# Patient Record
Sex: Female | Born: 1985
Health system: Southern US, Community
[De-identification: ages and names within clinical notes are randomized; demographics above are authoritative.]

## PROBLEM LIST (undated history)

## (undated) HISTORY — PX: RENAL BIOPSY: SHX156

---

## 2003-06-27 ENCOUNTER — Emergency Department (HOSPITAL_COMMUNITY): Admission: EM | Admit: 2003-06-27 | Discharge: 2003-06-27 | Payer: Self-pay | Admitting: Emergency Medicine

## 2012-04-28 ENCOUNTER — Encounter (HOSPITAL_COMMUNITY): Payer: Self-pay | Admitting: *Deleted

## 2012-04-28 ENCOUNTER — Emergency Department (HOSPITAL_COMMUNITY)
Admission: EM | Admit: 2012-04-28 | Discharge: 2012-04-29 | Disposition: A | Discharge status: Home / Self Care | Payer: Self-pay | Attending: Emergency Medicine | Admitting: Emergency Medicine

## 2012-04-28 DIAGNOSIS — K0889 Other specified disorders of teeth and supporting structures: Secondary | ICD-10-CM

## 2012-04-28 DIAGNOSIS — K029 Dental caries, unspecified: Secondary | ICD-10-CM | POA: Insufficient documentation

## 2012-04-28 DIAGNOSIS — F17200 Nicotine dependence, unspecified, uncomplicated: Secondary | ICD-10-CM | POA: Insufficient documentation

## 2012-04-28 NOTE — ED Notes (Signed)
Pt has had tooth pain x 3 weeks.  Tonight the pain became unbearable.  PT appears in great pain.

## 2012-04-29 MED ORDER — OXYCODONE-ACETAMINOPHEN 5-325 MG PO TABS
2.0000 | ORAL_TABLET | Freq: Once | ORAL | Status: AC
  Administered 2012-04-29: 2 via ORAL
  Filled 2012-04-29: qty 2

## 2012-04-29 MED ORDER — IBUPROFEN 800 MG PO TABS: 800.0000 mg | ORAL_TABLET | Freq: Three times a day (TID) | ORAL | Status: AC

## 2012-04-29 MED ORDER — HYDROCODONE-ACETAMINOPHEN 7.5-500 MG/15ML PO SOLN: 15.0000 mL | Freq: Four times a day (QID) | ORAL | Status: AC | PRN

## 2012-04-29 MED ORDER — PENICILLIN V POTASSIUM 500 MG PO TABS: 500.0000 mg | ORAL_TABLET | Freq: Four times a day (QID) | ORAL | Status: AC

## 2012-04-29 NOTE — ED Notes (Signed)
Pt. Discharge to home, pt. Alert and oriented,

## 2012-04-29 NOTE — Discharge Instructions (Signed)
Dental Pain A tooth ache may be caused by cavities (tooth decay). Cavities expose the nerve of the tooth to air and hot or cold temperatures. It may come from an infection or abscess (also called a boil or furuncle) around your tooth. It is also often caused by dental caries (tooth decay). This causes the pain you are having. DIAGNOSIS  Your caregiver can diagnose this problem by exam. TREATMENT   If caused by an infection, it may be treated with medications which kill germs (antibiotics) and pain medications as prescribed by your caregiver. Take medications as directed.   Only take over-the-counter or prescription medicines for pain, discomfort, or fever as directed by your caregiver.   Whether the tooth ache today is caused by infection or dental disease, you should see your dentist as soon as possible for further care.  SEEK MEDICAL CARE IF: The exam and treatment you received today has been provided on an emergency basis only. This is not a substitute for complete medical or dental care. If your problem worsens or new problems (symptoms) appear, and you are unable to meet with your dentist, call or return to this location. SEEK IMMEDIATE MEDICAL CARE IF:   You have a fever.   You develop redness and swelling of your face, jaw, or neck.   You are unable to open your mouth.   You have severe pain uncontrolled by pain medicine.  MAKE SURE YOU:   Understand these instructions.   Will watch your condition.   Will get help right away if you are not doing well or get worse.  Document Released: 11/17/2005 Document Revised: 11/06/2011 Document Reviewed: 07/05/2008   RESOURCE GUIDE  Dental Problems  Patients with Medicaid: [facility] [phone] [address]                                           [phone] [address] Phone:  [phone]                                                  Phone:  [phone]  If unable to pay or uninsured,  contact:  [facility] or [facility]. to become qualified for the adult dental clinic.  Chronic Pain Problems Contact [facility]  [phone] Patients need to be referred by their primary care doctor.  Insufficient Money for Medicine Contact [employer]:  call "211" or [facility] [phone].  No Primary Care Doctor Call [facility]  [phone] Other agencies that provide inexpensive medical care    [facility]  [phone]    [facility]  [phone]    [facility]  [phone]    [facility]  [phone]    [facility]  [phone]    [facility]  [phone]  [facility]  [phone] [facility]  [phone] [facility]   [phone] (emergency services [phone])  Substance Abuse [facility]  3052784240 [facility] [phone] [facility] [place] [phone] Floydene Flock [phone] Residential & Outpatient Substance  Abuse Program  2028312589  Abuse/Neglect [place] Child Abuse Hotline 7028107459 [facility] Child Abuse Hotline [phone] (After Hours)  Emergency Shelter [facility] [phone]  M[facility] Homes [facility] [place] [facility] [place] [phone] [facility] [phone]  MRSA Hotline #:   650-850-6698    [facility]

## 2012-04-29 NOTE — ED Provider Notes (Signed)
History     CSN: [id number]  Arrival date & time 04/28/12  2320   First MD Initiated Contact with Patient 04/28/12 2356      Chief Complaint  Patient presents with  . Jaw Pain    (Consider location/radiation/quality/duration/timing/severity/associated sxs/prior treatment) The history is provided by the patient.   right lower dental pain the last few weeks worse over the last 24 hours. Sharp in quality radiates to jaw. Unable to sleep due to pain. Hurts to chew on that side. No aggravating or alleviating factors otherwise. No fevers chills. No nausea vomiting. No difficulty swallowing or difficulty breathing. Has not had a tetanus. Smokes cigarettes daily.  Past Medical History  Diagnosis Date  . Asthma     Past Surgical History  Procedure Date  . Renal biopsy     No family history on file.  History  Substance Use Topics  . Smoking status: Current Everyday Smoker -- 1.0 packs/day    Types: Cigarettes  . Smokeless tobacco: Not on file  . Alcohol Use: No    OB History    Grav Para Term Preterm Abortions TAB SAB Ect Mult Living                  Review of Systems  Constitutional: Negative for fever and chills.  HENT: Positive for dental problem. Negative for neck pain and neck stiffness.   Eyes: Negative for pain.  Respiratory: Negative for shortness of breath.   Cardiovascular: Negative for chest pain.  Gastrointestinal: Negative for abdominal pain.  Genitourinary: Negative for dysuria.  Musculoskeletal: Negative for back pain.  Skin: Negative for rash.  Neurological: Negative for headaches.  All other systems reviewed and are negative.    Allergies  Review of patient's allergies indicates no known allergies.  Home Medications   Current Outpatient Rx  Name Route Sig Dispense Refill  . IBUPROFEN 200 MG PO TABS Oral Take 1,200 mg by mouth once as needed. For pain      BP 139/77  Pulse 85  Temp(Src) 98.3 F (36.8 C) (Oral)  Resp 16  SpO2  99%  Physical Exam  Constitutional: She is oriented to person, place, and time. She appears well-developed and well-nourished.  HENT:  Head: Normocephalic and atraumatic.       Poor dentition with caries and tenderness right lower second molar with tenderness to palpation. No gingival fluctuance or pointing. No trismus. Uvula midline. No associated jaw erythema or swelling. No lymphadenopathy.  Eyes: Conjunctivae and EOM are normal. Pupils are equal, round, and reactive to light.  Neck: Trachea normal. Neck supple. No thyromegaly present.  Cardiovascular: Normal rate, regular rhythm, S1 normal, S2 normal and normal pulses.     No systolic murmur is present   No diastolic murmur is present  Pulses:      Radial pulses are 2+ on the right side, and 2+ on the left side.  Pulmonary/Chest: Effort normal and breath sounds normal. She has no wheezes. She has no rhonchi. She has no rales. She exhibits no tenderness.  Abdominal: Soft. Normal appearance and bowel sounds are normal. There is no tenderness. There is no CVA tenderness and negative Murphy's sign.  Musculoskeletal:       BLE:s Calves nontender, no cords or erythema, negative Homans sign  Neurological: She is alert and oriented to person, place, and time. She has normal strength. No cranial nerve deficit or sensory deficit. GCS eye subscore is 4. GCS verbal subscore is 5. GCS motor subscore is  6.  Skin: Skin is warm and dry. No rash noted. She is not diaphoretic.  Psychiatric: Her speech is normal.       Cooperative and appropriate    ED Course  Dental Date/Time: 04/29/2012 12:54 AM Performed by: Sunnie Nielsen Authorized by: Sunnie Nielsen Consent: Verbal consent obtained. Risks and benefits: risks, benefits and alternatives were discussed Consent given by: patient Patient understanding: patient states understanding of the procedure being performed Patient consent: the patient's understanding of the procedure matches consent  given Procedure consent: procedure consent matches procedure scheduled Required items: required blood products, implants, devices, and special equipment available Patient identity confirmed: verbally with patient Time out: Immediately prior to procedure a "time out" was called to verify the correct patient, procedure, equipment, support staff and site/side marked as required. Preparation: Patient was prepped and draped in the usual sterile fashion. Local anesthesia used: yes Anesthesia: local infiltration Local anesthetic: bupivacaine 0.5% without epinephrine Anesthetic total: 2 ml Patient tolerance: Patient tolerated the procedure well with no immediate complications. Comments: Local infiltration 2 right lower second molar with good anesthesia achieved   (including critical care time)     MDM   Dental pain possible abscess with local block as above. Pain medications and antibiotics provided. Dental referral provided. Reliable historian verbalizes understanding and dental pain and infection precautions. Stable for discharge home        Sunnie Nielsen, MD 04/29/12 [phone]

## 2012-04-29 NOTE — ED Notes (Signed)
Received pt. From triage pt. Alert and oriented, respirations even and regular, NAD

## 2012-06-27 ENCOUNTER — Encounter (HOSPITAL_COMMUNITY): Payer: Self-pay | Admitting: Family

## 2012-06-27 ENCOUNTER — Inpatient Hospital Stay (HOSPITAL_COMMUNITY)
Admission: AD | Admit: 2012-06-27 | Discharge: 2012-06-27 | Disposition: A | Discharge status: Home / Self Care | Payer: Self-pay | Source: Ambulatory Visit | Attending: Obstetrics & Gynecology | Admitting: Obstetrics & Gynecology

## 2012-06-27 DIAGNOSIS — Z3202 Encounter for pregnancy test, result negative: Secondary | ICD-10-CM | POA: Insufficient documentation

## 2012-06-27 LAB — URINALYSIS, ROUTINE W REFLEX MICROSCOPIC
Bilirubin Urine: NEGATIVE
Hgb urine dipstick: NEGATIVE
Ketones, ur: NEGATIVE mg/dL
Protein, ur: NEGATIVE mg/dL
Urobilinogen, UA: 1 mg/dL (ref 0.0–1.0)

## 2012-06-27 NOTE — MAU Provider Note (Signed)
  History     CSN: [id number]  Arrival date and time: 06/27/12 1907   None     Chief Complaint  Patient presents with  . Possible Pregnancy   HPI  Pt states she's cramping and bleeding a little bit and she thinks she might be pregnant.  Here for pregnancy test and birth control.  Patient's last menstrual period was 06/04/2012.   Past Medical History  Diagnosis Date  . Asthma     Past Surgical History  Procedure Date  . Renal biopsy     No family history on file.  History  Substance Use Topics  . Smoking status: Current Everyday Smoker -- 1.0 packs/day    Types: Cigarettes  . Smokeless tobacco: Not on file  . Alcohol Use: No    Allergies:  Allergies  Allergen Reactions  . Keflex (Cephalexin) Itching, Nausea And Vomiting and Rash  . Vancomycin Itching    Prescriptions prior to admission  Medication Sig Dispense Refill  . acetaminophen (TYLENOL) 500 MG tablet Take 1,000 mg by mouth every 6 (six) hours as needed. pain      . calcium carbonate (TUMS - DOSED IN MG ELEMENTAL CALCIUM) 500 MG chewable tablet Chew 1 tablet by mouth daily as needed. heartburn        Review of Systems  Gastrointestinal: Positive for abdominal pain (cramping).  Genitourinary:       Vaginal spotting  All other systems reviewed and are negative.   Physical Exam   Blood pressure 115/84, pulse 110, temperature 98.4 F (36.9 C), temperature source Oral, resp. rate 16, height 5\' 4"  (1.626 m), weight 63.05 kg (139 lb), last menstrual period 06/04/2012, SpO2 100.00%.  Physical Exam  Constitutional: She is oriented to person, place, and time. She appears well-developed and well-nourished. No distress.  HENT:  Head: Normocephalic.  Neck: Normal range of motion. Neck supple.  GI: Soft. There is no tenderness.  Genitourinary: No bleeding around the vagina.  Neurological: She is alert and oriented to person, place, and time. She has normal reflexes.  Skin: Skin is warm and dry.    MAU  Course  Procedures  Results for orders placed during the hospital encounter of 06/27/12 (from the past 24 hour(s))  URINALYSIS, ROUTINE W REFLEX MICROSCOPIC     Status: Normal   Collection Time   06/27/12  7:18 PM      Component Value Range   Color, Urine YELLOW  YELLOW   APPearance CLEAR  CLEAR   Specific Gravity, Urine 1.020  1.005 - 1.030   pH 8.0  5.0 - 8.0   Glucose, UA NEGATIVE  NEGATIVE mg/dL   Hgb urine dipstick NEGATIVE  NEGATIVE   Bilirubin Urine NEGATIVE  NEGATIVE   Ketones, ur NEGATIVE  NEGATIVE mg/dL   Protein, ur NEGATIVE  NEGATIVE mg/dL   Urobilinogen, UA 1.0  0.0 - 1.0 mg/dL   Nitrite NEGATIVE  NEGATIVE   Leukocytes, UA NEGATIVE  NEGATIVE  POCT PREGNANCY, URINE     Status: Normal   Collection Time   06/27/12  7:29 PM      Component Value Range   Preg Test, Ur NEGATIVE  NEGATIVE     Assessment and Plan  Negative Pregnancy Test  Plan: DC to home Advised pt to recheck after menses is expected - August 1 - August 7 Referred to [facility] for family planning  [facility] 06/27/2012, 7:42 PM

## 2012-06-27 NOTE — MAU Note (Signed)
Pt reports "i think i am pregnant". States LMP07/03/2012, states cramping for several weeks and had some spotting . States the period on the 07/05 was not as heavy as normal.

## 2012-06-27 NOTE — MAU Note (Signed)
Pt states she cramping and bleeding a little bit and she thinks she might be pregnant

## 2012-07-07 ENCOUNTER — Inpatient Hospital Stay (HOSPITAL_COMMUNITY)
Admission: AD | Admit: 2012-07-07 | Discharge: 2012-07-07 | Disposition: A | Discharge status: Home / Self Care | Payer: Self-pay | Source: Ambulatory Visit | Attending: Obstetrics and Gynecology | Admitting: Obstetrics and Gynecology

## 2012-07-07 DIAGNOSIS — N911 Secondary amenorrhea: Secondary | ICD-10-CM

## 2012-07-07 DIAGNOSIS — Z3202 Encounter for pregnancy test, result negative: Secondary | ICD-10-CM | POA: Insufficient documentation

## 2012-07-07 DIAGNOSIS — N912 Amenorrhea, unspecified: Secondary | ICD-10-CM

## 2012-07-07 NOTE — MAU Note (Signed)
Pt states was seen 06/27/2012, had negative upt, today upt is negative. Having abnormal menstrual cycles. Here for upt only.

## 2012-07-16 ENCOUNTER — Encounter (HOSPITAL_COMMUNITY): Payer: Self-pay | Admitting: *Deleted

## 2012-07-16 ENCOUNTER — Emergency Department (HOSPITAL_COMMUNITY)
Admission: EM | Admit: 2012-07-16 | Discharge: 2012-07-16 | Disposition: A | Discharge status: Home / Self Care | Payer: Self-pay | Attending: Emergency Medicine | Admitting: Emergency Medicine

## 2012-07-16 DIAGNOSIS — F17200 Nicotine dependence, unspecified, uncomplicated: Secondary | ICD-10-CM | POA: Insufficient documentation

## 2012-07-16 DIAGNOSIS — K0889 Other specified disorders of teeth and supporting structures: Secondary | ICD-10-CM

## 2012-07-16 DIAGNOSIS — K029 Dental caries, unspecified: Secondary | ICD-10-CM | POA: Insufficient documentation

## 2012-07-16 MED ORDER — OXYCODONE-ACETAMINOPHEN 5-325 MG PO TABS
1.0000 | ORAL_TABLET | Freq: Once | ORAL | Status: AC
  Administered 2012-07-16: 1 via ORAL
  Filled 2012-07-16: qty 1

## 2012-07-16 MED ORDER — OXYCODONE-ACETAMINOPHEN 5-325 MG PO TABS: 1.0000 | ORAL_TABLET | Freq: Four times a day (QID) | ORAL | Status: AC | PRN

## 2012-07-16 NOTE — ED Notes (Signed)
Pt reports severe dental pain, right upper back, that started last night.

## 2012-07-16 NOTE — ED Provider Notes (Signed)
History   This chart was scribed for Charles B. Bernette Mayers, MD by Melba Coon. The patient was seen in room TR11C/TR11C and the patient's care was started at 11:11AM.    CSN: [id number]  Arrival date & time 07/16/12  1058   First MD Initiated Contact with Patient 07/16/12 1110      Chief Complaint  Patient presents with  . Dental Pain    (Consider location/radiation/quality/duration/timing/severity/associated sxs/prior treatment) HPI Kristen Mcgee is a 26 y.o. female who presents to the Emergency Department complaining of constant, moderate to severe right upper dental pain with an onset last night. Pt was here at the ED recently for right lower dental pain and was given penicillin that she did not completely use. Pt used 2 penicillin and 8 ibuprofen which did not alleviate the pain.vNo HA, fever, neck pain, sore throat, rash, back pain, CP, SOB, abd pain, n/v/d, dysuria, or extremity pain, edema, weakness, numbness, or tingling. No other pertinent medical symptoms.  Past Medical History  Diagnosis Date  . Asthma     Past Surgical History  Procedure Date  . Renal biopsy   . Cesarean section     History reviewed. No pertinent family history.  History  Substance Use Topics  . Smoking status: Current Everyday Smoker -- 1.0 packs/day    Types: Cigarettes  . Smokeless tobacco: Not on file  . Alcohol Use: No    OB History    Grav Para Term Preterm Abortions TAB SAB Ect Mult Living   2 1 1  1 1  0   1      Review of Systems 10 Systems reviewed and all are negative for acute change except as noted in the HPI.   Allergies  Keflex and Vancomycin  Home Medications   Current Outpatient Rx  Name Route Sig Dispense Refill  . ACETAMINOPHEN 500 MG PO TABS Oral Take 1,000 mg by mouth every 6 (six) hours as needed. pain    . CALCIUM CARBONATE ANTACID 500 MG PO CHEW Oral Chew 1 tablet by mouth daily as needed. heartburn      BP 128/95  Pulse 97  Temp 98.2 F (36.8 C)  (Oral)  Resp 22  SpO2 97%  LMP 06/04/2012  Physical Exam  Constitutional: She is oriented to person, place, and time. She appears well-developed and well-nourished.  HENT:  Head: Normocephalic and atraumatic.       Dental caries present.  Neck: Neck supple.  Pulmonary/Chest: Effort normal.  Neurological: She is alert and oriented to person, place, and time. No cranial nerve deficit.  Psychiatric: She has a normal mood and affect. Her behavior is normal.    ED Course  Procedures (including critical care time)  DIAGNOSTIC STUDIES: Oxygen Saturation is 97% on room air, normal by my interpretation.    COORDINATION OF CARE:  11:15AM - Pain meds will be Rx for the pt. Pt will be refered to a dentist. Pt ready for d/c.    Labs Reviewed - No data to display No results found.   No diagnosis found.    MDM  Dental pain without signs of abscess. Pain medication and dental followup.   I personally performed the services described in the documentation, which were scribed in my presence. The recorded information has been reviewed and considered.                Charles B. Bernette Mayers, MD 07/16/12 1119

## 2012-08-15 ENCOUNTER — Inpatient Hospital Stay (HOSPITAL_COMMUNITY)
Admission: AD | Admit: 2012-08-15 | Discharge: 2012-08-15 | Disposition: A | Discharge status: Home / Self Care | Payer: Self-pay | Source: Ambulatory Visit | Attending: Obstetrics & Gynecology | Admitting: Obstetrics & Gynecology

## 2012-08-15 DIAGNOSIS — Z3202 Encounter for pregnancy test, result negative: Secondary | ICD-10-CM | POA: Insufficient documentation

## 2012-08-15 DIAGNOSIS — N921 Excessive and frequent menstruation with irregular cycle: Secondary | ICD-10-CM | POA: Insufficient documentation

## 2012-08-15 NOTE — MAU Note (Signed)
Pt re[prts "i just need a pregnancy test", was here on 08/04 and it was negative, but still hasn't had a period.

## 2012-08-15 NOTE — MAU Provider Note (Signed)
S:  26 y.o. G2P1011 presents to MAU with irregular menses and concern that she is pregnant. Pt denies pain, vaginal bleeding, dizziness, h/a, n/v, or fever/chills.  She reports she has had negative urine pregnancy tests in the past but has been positive on blood tests.  O: BP 114/71  Pulse 86  Temp 98.3 F (36.8 C) (Oral)  Resp 18  Ht 5' 2.75" (1.594 m)  Wt 65.318 kg (144 lb)  BMI 25.71 kg/m2  SpO2 100%  LMP 06/06/2012  Results for orders placed during the hospital encounter of 08/15/12 (from the past 24 hour(s))  POCT PREGNANCY, URINE     Status: Normal   Collection Time   08/15/12  9:42 PM      Component Value Range   Preg Test, Ur NEGATIVE  NEGATIVE  HCG, SERUM, QUALITATIVE     Status: Normal   Collection Time   08/15/12 10:23 PM      Component Value Range   Preg, Serum NEGATIVE  NEGATIVE    A: Metrorrhagia  P: D/C home F/U at health department for contraception  Sharen Counter Certified Nurse-Midwife

## 2012-09-17 ENCOUNTER — Emergency Department (HOSPITAL_COMMUNITY)
Admission: EM | Admit: 2012-09-17 | Discharge: 2012-09-17 | Disposition: A | Discharge status: Home / Self Care | Payer: Self-pay | Attending: Emergency Medicine | Admitting: Emergency Medicine

## 2012-09-17 ENCOUNTER — Encounter (HOSPITAL_COMMUNITY): Payer: Self-pay

## 2012-09-17 DIAGNOSIS — F17200 Nicotine dependence, unspecified, uncomplicated: Secondary | ICD-10-CM | POA: Insufficient documentation

## 2012-09-17 DIAGNOSIS — K0889 Other specified disorders of teeth and supporting structures: Secondary | ICD-10-CM

## 2012-09-17 DIAGNOSIS — K029 Dental caries, unspecified: Secondary | ICD-10-CM | POA: Insufficient documentation

## 2012-09-17 MED ORDER — HYDROCODONE-ACETAMINOPHEN 5-325 MG PO TABS
1.0000 | ORAL_TABLET | Freq: Once | ORAL | Status: AC
  Administered 2012-09-17: 1 via ORAL
  Filled 2012-09-17: qty 1

## 2012-09-17 MED ORDER — PENICILLIN V POTASSIUM 500 MG PO TABS: 500.0000 mg | ORAL_TABLET | Freq: Four times a day (QID) | ORAL | Status: DC

## 2012-09-17 MED ORDER — HYDROCODONE-ACETAMINOPHEN 10-325 MG/15ML PO SOLN: 10.0000 mL | Freq: Four times a day (QID) | ORAL | Status: DC | PRN

## 2012-09-17 NOTE — ED Notes (Signed)
Pt called to say that hydrocodone-acetominophen soln is too expensive; after talking with dr Jodelle Gross knapp, she states to call in prescription for ultracet 50/325 take 2 tabs q6h prn, dispense 12 tabs. Pt called to this office (flow manager) to request lortab 5/500. Dr Jodelle Gross knapp states no; pt states she did not want ultracet to be called in to [facility] pharmacy.

## 2012-09-17 NOTE — ED Notes (Signed)
Heat pack given to patient for comfort.

## 2012-09-17 NOTE — ED Provider Notes (Signed)
History  This chart was scribed for Ward Givens, MD by Ladona Ridgel Day. This patient was seen in room TR02C/TR02C and the patient's care was started at 1151.   CSN: [id number]  Arrival date & time 09/17/12  1151   None     Chief Complaint  Patient presents with  . Dental Pain   The history is provided by the patient. No language interpreter was used.   Kristen Mcgee is a 26 y.o. female who presents to the Emergency Department complaining of constant gradually worsening right upper and lower dental pain worsened over 2 days associated with decay of her teeth. She states previous dental issues but has not been able to f/u w/dentist for financial reasons. She has tried leftover PCN and ibuprofen without improvement in her symptoms. She states jaw/right ear pain. She denies fever  This is her 3rd ED visit for toothache since May   . She smokes 1ppd, just began work at [employer] last week, and does not drink.  She has no PCP  Past Medical History  Diagnosis Date  . Asthma     Past Surgical History  Procedure Date  . Renal biopsy   . Cesarean section     No family history on file.  History  Substance Use Topics  . Smoking status: Current Every Day Smoker -- 1.0 packs/day    Types: Cigarettes  . Smokeless tobacco: Not on file  . Alcohol Use: No  moved here from [facility] 4 months ago Employed 1 week  OB History    Grav Para Term Preterm Abortions TAB SAB Ect Mult Living   2 1 1  1 1  0   1      Review of Systems  Constitutional: Negative for fever and chills.  HENT: Positive for ear pain (right ear pain) and dental problem (right upper and lower dental decay). Negative for facial swelling.   Respiratory: Negative for shortness of breath.   Gastrointestinal: Negative for nausea and vomiting.  Neurological: Negative for weakness.  All other systems reviewed and are negative.    Allergies  Keflex and Vancomycin  Home Medications   Current Outpatient Rx  Name  Route Sig Dispense Refill  . IBUPROFEN 200 MG PO TABS Oral Take 600 mg by mouth every 4 (four) hours as needed. For pain    . PENICILLIN V POTASSIUM 500 MG PO TABS Oral Take 500 mg by mouth 2 (two) times daily.      Triage Vitals: BP 126/76  Pulse 77  Temp 98.3 F (36.8 C) (Oral)  Resp 20  Ht 5\' 4"  (1.626 m)  Wt 145 lb (65.772 kg)  BMI 24.89 kg/m2  SpO2 99%  LMP 08/25/2012  Vital signs normal    Physical Exam  Nursing note and vitals reviewed. Constitutional: She is oriented to person, place, and time. She appears well-developed and well-nourished.  Non-toxic appearance. She does not appear ill. No distress.  HENT:  Head: Normocephalic and atraumatic.  Right Ear: External ear normal.  Left Ear: External ear normal.  Nose: Nose normal. No mucosal edema or rhinorrhea.  Mouth/Throat: Oropharynx is clear and moist and mucous membranes are normal. No dental abscesses or uvula swelling.       Left and right lower last two molars are rotted to gumline. # 5  with cavity and she indicates it as the source of her pain. . No gum or facial swelling.     Eyes: Conjunctivae normal and EOM are normal. Pupils are  equal, round, and reactive to light.  Neck: Normal range of motion and full passive range of motion without pain. Neck supple.  Pulmonary/Chest: Effort normal. No respiratory distress. She has no rhonchi. She exhibits no crepitus.  Abdominal: Normal appearance.  Musculoskeletal: Normal range of motion. She exhibits no edema and no tenderness.       Moves all extremities well.   Neurological: She is alert and oriented to person, place, and time. She has normal strength. No cranial nerve deficit.  Skin: Skin is warm, dry and intact. No rash noted. No erythema. No pallor.  Psychiatric: She has a normal mood and affect. Her speech is normal and behavior is normal. Her mood appears not anxious.    ED Course  Procedures (including critical care time)   Medications    HYDROcodone-acetaminophen (NORCO/VICODIN) 5-325 MG per tablet 1 tablet (not administered)     Medications  penicillin v potassium (VEETID) 500 MG tablet (not administered)  Hydrocodone-Acetaminophen 10-325 MG/15ML SOLN (not administered)  HYDROcodone-acetaminophen (NORCO/VICODIN) 5-325 MG per tablet 1 tablet (not administered)   DIAGNOSTIC STUDIES: Oxygen Saturation is 99% on room air, normal by my interpretation.    COORDINATION OF CARE: At 120 AM Discussed treatment plan with patient which includes follow up with dentist, advised pt to call dentist within 48 hours of ER visit to assist in payments. Patient agrees.       1. Toothache   2. Dental caries     New Prescriptions   HYDROCODONE-ACETAMINOPHEN 10-325 MG/15ML SOLN    Take 10 mLs by mouth every 6 (six) hours as needed.   PENICILLIN V POTASSIUM (VEETID) 500 MG TABLET    Take 1 tablet (500 mg total) by mouth 4 (four) times daily.    Plan discharge  Devoria Albe, MD, FACEP   MDM  I personally performed the services described in this documentation, which was scribed in my presence. The recorded information has been reviewed and considered.  Devoria Albe, MD, Armando Gang          Ward Givens, MD 09/17/12 1341

## 2012-09-17 NOTE — ED Notes (Signed)
Pt presents with 2 day h/o R upper and lower tooth pain, pt reports broken teeth to area, reports jaw feels like it is "locking up" when she eats.   Pt reports taking penicillin and ibuprofen with no relief.

## 2012-09-17 NOTE — ED Notes (Signed)
Patient has right upper and lower tooth pain that radiates to the temple up the right side of her head, redness and inflammation is noted gum area. Pain is 9/10.

## 2012-09-18 ENCOUNTER — Telehealth (HOSPITAL_COMMUNITY): Payer: Self-pay | Admitting: *Deleted

## 2012-09-18 NOTE — ED Notes (Signed)
Patient have tried several times to get rx changed for different reasons. Pharmacist called wanting to fill hydrocodone  Rx only,pharcmacist was informed that rx for PCN need to be filled also.Dr Lynelle Doctor changed order to Ultracet 50/325 take 2 tabs q 6 hrs dispense 12 tabs.Pharmacist was informed by Henderson Cloud.PFM not to give rx back to patient.

## 2012-09-21 ENCOUNTER — Emergency Department (HOSPITAL_COMMUNITY)
Admission: EM | Admit: 2012-09-21 | Discharge: 2012-09-21 | Disposition: A | Discharge status: Home / Self Care | Payer: Self-pay | Attending: Emergency Medicine | Admitting: Emergency Medicine

## 2012-09-21 ENCOUNTER — Encounter (HOSPITAL_COMMUNITY): Payer: Self-pay | Admitting: Adult Health

## 2012-09-21 DIAGNOSIS — F17200 Nicotine dependence, unspecified, uncomplicated: Secondary | ICD-10-CM | POA: Insufficient documentation

## 2012-09-21 DIAGNOSIS — J45909 Unspecified asthma, uncomplicated: Secondary | ICD-10-CM | POA: Insufficient documentation

## 2012-09-21 DIAGNOSIS — K0889 Other specified disorders of teeth and supporting structures: Secondary | ICD-10-CM

## 2012-09-21 DIAGNOSIS — K047 Periapical abscess without sinus: Secondary | ICD-10-CM | POA: Insufficient documentation

## 2012-09-21 DIAGNOSIS — K089 Disorder of teeth and supporting structures, unspecified: Secondary | ICD-10-CM | POA: Insufficient documentation

## 2012-09-21 LAB — POCT PREGNANCY, URINE: Preg Test, Ur: NEGATIVE

## 2012-09-21 MED ORDER — HYDROCODONE-ACETAMINOPHEN 5-325 MG PO TABS
2.0000 | ORAL_TABLET | Freq: Once | ORAL | Status: AC
  Administered 2012-09-21: 2 via ORAL
  Filled 2012-09-21: qty 2

## 2012-09-21 NOTE — ED Provider Notes (Signed)
History     CSN: [id number]  Arrival date & time 09/21/12  0005   First MD Initiated Contact with Patient 09/21/12 0013      Chief Complaint  Patient presents with  . Dental Pain   HPI  History provided by the patient. Patient is a 26 year old female who presents with complaints of right upper first premolar tooth pain. Patient reports being seen for dental pains recently and had a dentist appointment earlier today. Her dentist pulled to her right lower molars. She was also told that she had an infection to her right upper tooth and was given prescriptions for pain medication and antibiotics. Patient states she was unable to afford medicines tonight and plans to purchase these tomorrow. She has worsening pain to her right upper tooth. She has used Advil without any improvement. She denies any swelling of the gums. Denies any fever, chills or sweats. Denies any difficulty swallowing or breathing.    Past Medical History  Diagnosis Date  . Asthma     Past Surgical History  Procedure Date  . Renal biopsy   . Cesarean section     History reviewed. No pertinent family history.  History  Substance Use Topics  . Smoking status: Current Every Day Smoker -- 1.0 packs/day    Types: Cigarettes  . Smokeless tobacco: Not on file  . Alcohol Use: No    OB History    Grav Para Term Preterm Abortions TAB SAB Ect Mult Living   2 1 1  1 1  0   1      Review of Systems  Constitutional: Negative for fever and chills.  HENT: Positive for dental problem. Negative for sore throat and neck pain.   Gastrointestinal: Negative for vomiting and diarrhea.  Skin: Negative for rash.    Allergies  Keflex and Vancomycin  Home Medications   Current Outpatient Rx  Name Route Sig Dispense Refill  . IBUPROFEN 200 MG PO TABS Oral Take 600 mg by mouth every 4 (four) hours as needed. For pain      BP 125/72  Pulse 71  Temp 98.1 F (36.7 C)  Resp 16  SpO2 100%  LMP 08/25/2012  Physical  Exam  Nursing note and vitals reviewed. Constitutional: She is oriented to person, place, and time. She appears well-developed and well-nourished. No distress.  HENT:  Head: Normocephalic.  Mouth/Throat:         There is fracture and dental caries to the right upper premolar with pain to percussion. No swelling of adjacent gums.  Cardiovascular: Normal rate and regular rhythm.   Pulmonary/Chest: Effort normal and breath sounds normal.  Abdominal: Soft.  Neurological: She is alert and oriented to person, place, and time.  Skin: Skin is warm and dry. No rash noted.  Psychiatric: She has a normal mood and affect. Her behavior is normal.    ED Course  Procedures   Results for orders placed during the hospital encounter of 09/21/12  POCT PREGNANCY, URINE      Component Value Range   Preg Test, Ur NEGATIVE  NEGATIVE     1. Pain, dental   2. Periapical abscess       MDM  Patient seen and evaluated. Patient appears in mild-to-moderate discomfort but in no acute distress.        Angus Seller, [place] 09/21/12 [phone]

## 2012-09-21 NOTE — ED Provider Notes (Signed)
Medical screening examination/treatment/procedure(s) were performed by non-physician practitioner and as supervising physician I was immediately available for consultation/collaboration.  Vennela Jutte Lytle Michaels, MD 09/21/12 [phone]

## 2012-09-21 NOTE — ED Notes (Signed)
PA at bedside.

## 2012-09-21 NOTE — ED Notes (Signed)
Seen here Friday for right sided dental pain given two RX that she was unable to afford, went to dentist today and had 2 teeth pulled, c/o pain "feels like I was punched in face" pt also requesting pregnancy test.

## 2012-09-22 ENCOUNTER — Emergency Department (HOSPITAL_COMMUNITY)
Admission: EM | Admit: 2012-09-22 | Discharge: 2012-09-22 | Disposition: A | Discharge status: Home / Self Care | Payer: Self-pay | Attending: Emergency Medicine | Admitting: Emergency Medicine

## 2012-09-22 ENCOUNTER — Encounter (HOSPITAL_COMMUNITY): Payer: Self-pay | Admitting: *Deleted

## 2012-09-22 DIAGNOSIS — F17200 Nicotine dependence, unspecified, uncomplicated: Secondary | ICD-10-CM | POA: Insufficient documentation

## 2012-09-22 DIAGNOSIS — J45909 Unspecified asthma, uncomplicated: Secondary | ICD-10-CM | POA: Insufficient documentation

## 2012-09-22 DIAGNOSIS — K029 Dental caries, unspecified: Secondary | ICD-10-CM | POA: Insufficient documentation

## 2012-09-22 MED ORDER — HYDROCODONE-ACETAMINOPHEN 5-325 MG PO TABS
1.0000 | ORAL_TABLET | Freq: Once | ORAL | Status: AC
  Administered 2012-09-22: 1 via ORAL
  Filled 2012-09-22: qty 1

## 2012-09-22 MED ORDER — HYDROCODONE-ACETAMINOPHEN 5-500 MG PO TABS: 1.0000 | ORAL_TABLET | Freq: Four times a day (QID) | ORAL | Status: DC | PRN

## 2012-09-22 NOTE — ED Notes (Signed)
Pt to ED c/o dental pain.  She was seen here [place] and referred to a dentist who pulled 2 teeth.  She was told she would be able to get her mediations on the $4 med plan at [facility], but her meds are too expensive.

## 2012-09-22 NOTE — ED Provider Notes (Signed)
History     CSN: [id number]  Arrival date & time 09/22/12  4098   First MD Initiated Contact with Patient 09/22/12 0531      Chief Complaint  Patient presents with  . Dental Pain    (Consider location/radiation/quality/duration/timing/severity/associated sxs/prior treatment) HPI Comments: Patient states she had 2 teeth pulled on the right lower shin is another cavity that will need extraction after the abscess was done she is taking penicillin 1 tablet 4 times a day she was unable to fill the prescription for pain medicine because they were written on the same prescription as the penicillin was generally felt to another provider she is here today requesting a separate prescription for pain control  Patient is a 26 y.o. female presenting with tooth pain. The history is provided by the patient.  Dental PainThe primary symptoms include mouth pain. Primary symptoms do not include headaches or fever. The symptoms began 3 to 5 days ago. The symptoms are unchanged. The symptoms occur constantly.    Past Medical History  Diagnosis Date  . Asthma     Past Surgical History  Procedure Date  . Renal biopsy   . Cesarean section     No family history on file.  History  Substance Use Topics  . Smoking status: Current Every Day Smoker -- 1.0 packs/day    Types: Cigarettes  . Smokeless tobacco: Not on file  . Alcohol Use: No    OB History    Grav Para Term Preterm Abortions TAB SAB Ect Mult Living   2 1 1  1 1  0   1      Review of Systems  Constitutional: Negative for fever and chills.  Skin: Negative for wound.  Neurological: Negative for dizziness and headaches.    Allergies  Keflex and Vancomycin  Home Medications   Current Outpatient Rx  Name Route Sig Dispense Refill  . HYDROCODONE-ACETAMINOPHEN 5-500 MG PO TABS Oral Take 1-2 tablets by mouth every 6 (six) hours as needed for pain. 15 tablet 0  . IBUPROFEN 200 MG PO TABS Oral Take 600 mg by mouth every 4 (four)  hours as needed. For pain      BP 111/80  Pulse 64  Temp 97.9 F (36.6 C) (Oral)  Resp 16  SpO2 99%  LMP 08/25/2012  Physical Exam  Constitutional: She appears well-developed and well-nourished.  HENT:  Head: Normocephalic.  Mouth/Throat:    Eyes: Pupils are equal, round, and reactive to light.  Neck: Normal range of motion.  Cardiovascular: Normal rate.   Pulmonary/Chest: Effort normal.    ED Course  Procedures (including critical care time)  Labs Reviewed - No data to display No results found.   1. Dental decay       MDM  Recent his obstructions appear to be healing well there is a large cavity with surrounding erythema and gum swelling on the right upper         Arman Filter, NP 09/22/12 0539  Arman Filter, NP 09/22/12 [phone]

## 2012-09-22 NOTE — ED Provider Notes (Signed)
Medical screening examination/treatment/procedure(s) were performed by non-physician practitioner and as supervising physician I was immediately available for consultation/collaboration.  Donetta Isaza, MD 09/22/12 0615 

## 2012-10-10 ENCOUNTER — Emergency Department (HOSPITAL_COMMUNITY)
Admission: EM | Admit: 2012-10-10 | Discharge: 2012-10-11 | Disposition: A | Discharge status: Home / Self Care | Payer: Self-pay | Attending: Emergency Medicine | Admitting: Emergency Medicine

## 2012-10-10 ENCOUNTER — Encounter (HOSPITAL_COMMUNITY): Payer: Self-pay | Admitting: Emergency Medicine

## 2012-10-10 DIAGNOSIS — K047 Periapical abscess without sinus: Secondary | ICD-10-CM | POA: Insufficient documentation

## 2012-10-10 DIAGNOSIS — R221 Localized swelling, mass and lump, neck: Secondary | ICD-10-CM | POA: Insufficient documentation

## 2012-10-10 DIAGNOSIS — R6884 Jaw pain: Secondary | ICD-10-CM | POA: Insufficient documentation

## 2012-10-10 DIAGNOSIS — K0889 Other specified disorders of teeth and supporting structures: Secondary | ICD-10-CM

## 2012-10-10 DIAGNOSIS — J45909 Unspecified asthma, uncomplicated: Secondary | ICD-10-CM | POA: Insufficient documentation

## 2012-10-10 DIAGNOSIS — R51 Headache: Secondary | ICD-10-CM | POA: Insufficient documentation

## 2012-10-10 DIAGNOSIS — R220 Localized swelling, mass and lump, head: Secondary | ICD-10-CM | POA: Insufficient documentation

## 2012-10-10 DIAGNOSIS — K029 Dental caries, unspecified: Secondary | ICD-10-CM | POA: Insufficient documentation

## 2012-10-10 DIAGNOSIS — F17200 Nicotine dependence, unspecified, uncomplicated: Secondary | ICD-10-CM | POA: Insufficient documentation

## 2012-10-10 NOTE — ED Notes (Signed)
Pt has two broken teeth in back of left lower mouth. Teeth have been broken for months. Reports dental pain this am 8/10. States that she has taken Ibuprofen with no relief. Pt recently had two teeth extracted from right lower mouth.

## 2012-10-10 NOTE — ED Notes (Signed)
Patient complaining of left sided dental and jaw pain; patient reports that she has several broken teeth on the left side and a history of dental pain in the past.  Last seen dentist a couple of weeks ago for right sided dental pain (had two teeth extracted).

## 2012-10-11 MED ORDER — OXYCODONE-ACETAMINOPHEN 5-325 MG PO TABS: 2.0000 | ORAL_TABLET | ORAL | Status: DC | PRN

## 2012-10-11 MED ORDER — PENICILLIN V POTASSIUM 500 MG PO TABS: 500.0000 mg | ORAL_TABLET | Freq: Three times a day (TID) | ORAL | Status: DC

## 2012-10-11 NOTE — ED Provider Notes (Signed)
History     CSN: [id number]  Arrival date & time 10/10/12  2330   First MD Initiated Contact with Patient 10/10/12 2343      Chief Complaint  Patient presents with  . Dental Pain  . Jaw Pain   HPI  History provided by the patient. Patient is a 26 year old female with history of asthma who presents with complaints of left lower molar dental pain. Patient states symptoms have been increasing for the past few days. Patient has been using Advil for symptoms without any improvement. Patient also had a small amount of penicillin from old prescription left and did take a dose of this without any changes. Symptoms are worse with pressure over the teeth and area. There also associated with some swelling of the jaw. Patient denies any other aggravating or alleviating factors. She denies any associated fever, chills, sweats, difficulty swallowing or difficulty breathing.    Past Medical History  Diagnosis Date  . Asthma     Past Surgical History  Procedure Date  . Renal biopsy   . Cesarean section     History reviewed. No pertinent family history.  History  Substance Use Topics  . Smoking status: Current Every Day Smoker -- 1.0 packs/day    Types: Cigarettes  . Smokeless tobacco: Not on file  . Alcohol Use: No    OB History    Grav Para Term Preterm Abortions TAB SAB Ect Mult Living   2 1 1  1 1  0   1      Review of Systems  Constitutional: Negative for fever, chills, diaphoresis and appetite change.  HENT: Positive for facial swelling and dental problem. Negative for sore throat, trouble swallowing, neck pain, neck stiffness and voice change.   Gastrointestinal: Negative for nausea and vomiting.  Neurological: Positive for headaches. Negative for light-headedness.    Allergies  Keflex and Vancomycin  Home Medications   Current Outpatient Rx  Name  Route  Sig  Dispense  Refill  . HYDROCODONE-ACETAMINOPHEN 5-500 MG PO TABS   Oral   Take 1-2 tablets by mouth every 6  (six) hours as needed for pain.   15 tablet   0   . IBUPROFEN 200 MG PO TABS   Oral   Take 600 mg by mouth every 4 (four) hours as needed. For pain           BP 110/68  Pulse 77  Temp 98.8 F (37.1 C) (Oral)  Resp 18  SpO2 100%  LMP 09/24/2012  Physical Exam  Nursing note and vitals reviewed. Constitutional: She is oriented to person, place, and time. She appears well-developed and well-nourished. No distress.  HENT:  Head: Normocephalic.  Mouth/Throat: Oropharynx is clear and moist.         There is significant decay to the left lower first and second molars. There is also several dental caries throughout. Evidence of previous dental extractions throughout. Mild swelling of the lateral gum is adjacent to the left lower molars. There is tenderness to palpation and percussion over the teeth.  Neck: Normal range of motion. Neck supple.  Cardiovascular: Normal rate and regular rhythm.   Pulmonary/Chest: Effort normal and breath sounds normal.  Lymphadenopathy:    She has no cervical adenopathy.  Neurological: She is alert and oriented to person, place, and time.  Skin: Skin is warm and dry. No rash noted. No erythema.  Psychiatric: She has a normal mood and affect. Her behavior is normal.    ED Course  Procedures     1. Pain, dental   2. Periapical abscess       MDM  11:50PM patient seen and evaluated. Patient appears uncomfortable in no acute distress.  Patient has refused a dental block. At this time we'll give prescriptions for pain medications and antibiotic and give dental referral.      Angus Seller, PA 10/11/12 [phone]

## 2012-10-11 NOTE — ED Provider Notes (Signed)
Medical screening examination/treatment/procedure(s) were performed by non-physician practitioner and as supervising physician I was immediately available for consultation/collaboration.  Sharronda Schweers M Snyder Colavito, MD 10/11/12 0504 

## 2013-05-03 ENCOUNTER — Emergency Department (HOSPITAL_BASED_OUTPATIENT_CLINIC_OR_DEPARTMENT_OTHER): Payer: Self-pay

## 2013-05-03 ENCOUNTER — Emergency Department (HOSPITAL_BASED_OUTPATIENT_CLINIC_OR_DEPARTMENT_OTHER)
Admission: EM | Admit: 2013-05-03 | Discharge: 2013-05-03 | Disposition: A | Discharge status: Home / Self Care | Payer: Self-pay | Attending: Emergency Medicine | Admitting: Emergency Medicine

## 2013-05-03 ENCOUNTER — Encounter (HOSPITAL_BASED_OUTPATIENT_CLINIC_OR_DEPARTMENT_OTHER): Payer: Self-pay | Admitting: Family Medicine

## 2013-05-03 DIAGNOSIS — F17200 Nicotine dependence, unspecified, uncomplicated: Secondary | ICD-10-CM | POA: Insufficient documentation

## 2013-05-03 DIAGNOSIS — Z79899 Other long term (current) drug therapy: Secondary | ICD-10-CM | POA: Insufficient documentation

## 2013-05-03 DIAGNOSIS — W1809XA Striking against other object with subsequent fall, initial encounter: Secondary | ICD-10-CM | POA: Insufficient documentation

## 2013-05-03 DIAGNOSIS — R109 Unspecified abdominal pain: Secondary | ICD-10-CM | POA: Insufficient documentation

## 2013-05-03 DIAGNOSIS — Y9289 Other specified places as the place of occurrence of the external cause: Secondary | ICD-10-CM | POA: Insufficient documentation

## 2013-05-03 DIAGNOSIS — J45901 Unspecified asthma with (acute) exacerbation: Secondary | ICD-10-CM | POA: Insufficient documentation

## 2013-05-03 DIAGNOSIS — Z3202 Encounter for pregnancy test, result negative: Secondary | ICD-10-CM | POA: Insufficient documentation

## 2013-05-03 DIAGNOSIS — R059 Cough, unspecified: Secondary | ICD-10-CM | POA: Insufficient documentation

## 2013-05-03 DIAGNOSIS — R05 Cough: Secondary | ICD-10-CM | POA: Insufficient documentation

## 2013-05-03 DIAGNOSIS — R0781 Pleurodynia: Secondary | ICD-10-CM

## 2013-05-03 DIAGNOSIS — Y939 Activity, unspecified: Secondary | ICD-10-CM | POA: Insufficient documentation

## 2013-05-03 DIAGNOSIS — S298XXA Other specified injuries of thorax, initial encounter: Secondary | ICD-10-CM | POA: Insufficient documentation

## 2013-05-03 LAB — URINALYSIS, ROUTINE W REFLEX MICROSCOPIC
Bilirubin Urine: NEGATIVE
Glucose, UA: NEGATIVE mg/dL
Hgb urine dipstick: NEGATIVE
Ketones, ur: NEGATIVE mg/dL
Nitrite: NEGATIVE
Protein, ur: NEGATIVE mg/dL
Specific Gravity, Urine: 1.02 (ref 1.005–1.030)
Urobilinogen, UA: 1 mg/dL (ref 0.0–1.0)
pH: 6 (ref 5.0–8.0)

## 2013-05-03 LAB — URINE MICROSCOPIC-ADD ON

## 2013-05-03 LAB — PREGNANCY, URINE: Preg Test, Ur: NEGATIVE

## 2013-05-03 MED ORDER — HYDROCODONE-ACETAMINOPHEN 5-325 MG PO TABS: ORAL_TABLET | ORAL | Status: DC

## 2013-05-03 MED ORDER — ACETAMINOPHEN-CODEINE #3 300-30 MG PO TABS: 1.0000 | ORAL_TABLET | Freq: Once | ORAL | Status: DC

## 2013-05-03 MED ORDER — LIDOCAINE-EPINEPHRINE-TETRACAINE (LET) SOLUTION: 3.0000 mL | Freq: Once | NASAL | Status: DC

## 2013-05-03 MED ORDER — ALBUTEROL SULFATE HFA 108 (90 BASE) MCG/ACT IN AERS: 1.0000 | INHALATION_SPRAY | Freq: Four times a day (QID) | RESPIRATORY_TRACT | Status: DC | PRN

## 2013-05-03 MED ORDER — HYDROCODONE-ACETAMINOPHEN 5-325 MG PO TABS
2.0000 | ORAL_TABLET | Freq: Once | ORAL | Status: AC
  Administered 2013-05-03: 2 via ORAL
  Filled 2013-05-03: qty 2

## 2013-05-03 MED ORDER — ACETAMINOPHEN 325 MG PO TABS: 650.0000 mg | ORAL_TABLET | Freq: Once | ORAL | Status: DC

## 2013-05-03 NOTE — ED Notes (Signed)
MD at bedside. 

## 2013-05-03 NOTE — ED Provider Notes (Signed)
History     CSN: [id number]  Arrival date & time 05/03/13  0819   First MD Initiated Contact with Patient 05/03/13 0825      Chief Complaint  Patient presents with  . Fall    (Consider location/radiation/quality/duration/timing/severity/associated sxs/prior treatment) HPI Comments: Pt had traveled with friends to [facility] in early March, was there for several weeks.  About 2 weeks ago, had been intoxicated, fell and struck right ribs.  Only mild pain there for a few days.  Then about 4 days afterwards, woke up with worse pain and has been waxing and waning since.  She reports about 5 days ago, pain was getting better, now worse again, hard to breathe due to pain, feels like she needs to cough, but painful to do so.  Came back from [facility] a few days ago.  No calf tenderness or swelling.  No N/V/D.  No urinary frequency.  Radiates to middle right back and also to right lower chest wall.  Hurst to palpate. OTC meds are not helping.  She has asthma, has been coughing, but no overt wheeze, no fevers, chills.  She admits she was playing basketball with child a few days ago, may have aggravated it.  No dysuria or frequency.  Pain is not worse with eating.  Patient is a 27 y.o. female presenting with fall. The history is provided by the patient.  Fall Associated symptoms include chest pain and shortness of breath. Pertinent negatives include no abdominal pain.    Past Medical History  Diagnosis Date  . Asthma     Past Surgical History  Procedure Laterality Date  . Renal biopsy    . Cesarean section      History reviewed. No pertinent family history.  History  Substance Use Topics  . Smoking status: Current Every Day Smoker -- 1.00 packs/day    Types: Cigarettes  . Smokeless tobacco: Not on file  . Alcohol Use: Yes    OB History   Grav Para Term Preterm Abortions TAB SAB Ect Mult Living   2 1 1  1 1  0   1      Review of Systems  Constitutional: Negative for fever and chills.    Respiratory: Positive for cough and shortness of breath. Negative for wheezing.   Cardiovascular: Positive for chest pain. Negative for palpitations and leg swelling.  Gastrointestinal: Negative for nausea, vomiting, abdominal pain and diarrhea.  Genitourinary: Positive for flank pain. Negative for dysuria and frequency.  Skin: Negative for rash.  Hematological: Does not bruise/bleed easily.  All other systems reviewed and are negative.    Allergies  Keflex and Vancomycin  Home Medications   Current Outpatient Rx  Name  Route  Sig  Dispense  Refill  . albuterol (PROVENTIL HFA;VENTOLIN HFA) 108 (90 BASE) MCG/ACT inhaler   Inhalation   Inhale 1-2 puffs into the lungs every 6 (six) hours as needed for wheezing.   1 Inhaler   0   . HYDROcodone-acetaminophen (NORCO/VICODIN) 5-325 MG per tablet      1-2 tablets po q 6 hours prn moderate to severe pain   20 tablet   0   . ibuprofen (ADVIL,MOTRIN) 200 MG tablet   Oral   Take 600 mg by mouth every 4 (four) hours as needed. For pain         . oxyCODONE-acetaminophen (PERCOCET) 5-325 MG per tablet   Oral   Take 2 tablets by mouth every 4 (four) hours as needed for pain.  10 tablet   0   . penicillin v potassium (VEETID) 500 MG tablet   Oral   Take 1 tablet (500 mg total) by mouth 3 (three) times daily.   30 tablet   0   . penicillin v potassium (VEETID) 500 MG tablet   Oral   Take 500 mg by mouth 4 (four) times daily.           BP 109/64  Pulse 73  Temp(Src) 97.7 F (36.5 C) (Oral)  Resp 16  SpO2 100%  LMP 04/06/2013  Physical Exam  Nursing note and vitals reviewed. Constitutional: She is oriented to person, place, and time. She appears well-developed and well-nourished. No distress.  HENT:  Head: Normocephalic and atraumatic.  Eyes: EOM are normal. No scleral icterus.  Neck: Normal range of motion. Neck supple.  Cardiovascular: Normal rate, regular rhythm and intact distal pulses.   No murmur  heard. Pulmonary/Chest: Effort normal and breath sounds normal. No respiratory distress. She has no wheezes. She has no rales. She exhibits tenderness. She exhibits no crepitus.    Abdominal: Soft. She exhibits no distension. There is tenderness. There is no rebound and no guarding.  Musculoskeletal: Normal range of motion. She exhibits no edema and no tenderness.       Back:  Neurological: She is alert and oriented to person, place, and time. She exhibits normal muscle tone. Coordination normal.  Skin: Skin is warm and dry. No rash noted. She is not diaphoretic.  Psychiatric: She has a normal mood and affect.    ED Course  Procedures (including critical care time)  Labs Reviewed  URINALYSIS, ROUTINE W REFLEX MICROSCOPIC - Abnormal; Notable for the following:    APPearance CLOUDY (*)    Leukocytes, UA TRACE (*)    All other components within normal limits  URINE MICROSCOPIC-ADD ON - Abnormal; Notable for the following:    Squamous Epithelial / LPF FEW (*)    Bacteria, UA MANY (*)    All other components within normal limits  URINE CULTURE  PREGNANCY, URINE   Dg Ribs Unilateral W/chest Right  05/03/2013   *RADIOLOGY REPORT*  Clinical Data: Fall  RIGHT RIBS AND CHEST - 3+ VIEW  Comparison: None.  Findings: Four views right ribs submitted.  Cardiomediastinal silhouette is unremarkable.  No acute infiltrate or pulmonary edema.  No right rib fracture is identified.  No diagnostic pneumothorax.  IMPRESSION: No right rib fracture is identified.  No diagnostic pneumothorax.   Original Report Authenticated By: Belladonna Mead, M.D.     1. Rib pain on right side     ra sat is 100% and I interpret to be normal   On recheck: Pain is somewhat improved.  Plain films were negative.  Discussed need for outpt follow up as routine, return for high fever, abd pain, vomiting.    MDM  PERC negative.  L:ungs clear.  Likely musculoskeletal pain.  Reproducible.  No rash, fever.  Neg Murphy's sign and no  symptoms consistent with biliary colic.  Will check UA, but no UTI symptoms.          Gavin Pound. Kamarah Bilotta, MD 05/03/13 [phone]

## 2013-05-03 NOTE — ED Notes (Signed)
Acuity changed based on plan of care.

## 2013-05-03 NOTE — ED Notes (Signed)
Pt c/o right rib pain post fall 2 wks ago. Pt reports pain with movement and cough. Pt also c/o feeling "congested".

## 2013-05-03 NOTE — Discharge Instructions (Signed)
Narcotic and benzodiazepine use may cause drowsiness, slowed breathing or dependence.  Please use with caution and do not drive, operate machinery or watch young children alone while taking them.  Taking combinations of these medications or drinking alcohol will potentiate these effects.    

## 2013-05-04 LAB — URINE CULTURE
Colony Count: NO GROWTH
Culture: NO GROWTH

## 2013-05-08 ENCOUNTER — Encounter (HOSPITAL_COMMUNITY): Payer: Self-pay | Admitting: *Deleted

## 2013-05-08 ENCOUNTER — Emergency Department (HOSPITAL_COMMUNITY): Payer: Self-pay

## 2013-05-08 ENCOUNTER — Emergency Department (HOSPITAL_COMMUNITY)
Admission: EM | Admit: 2013-05-08 | Discharge: 2013-05-08 | Disposition: A | Discharge status: Home / Self Care | Payer: Self-pay | Attending: Emergency Medicine | Admitting: Emergency Medicine

## 2013-05-08 DIAGNOSIS — Y939 Activity, unspecified: Secondary | ICD-10-CM | POA: Insufficient documentation

## 2013-05-08 DIAGNOSIS — Y929 Unspecified place or not applicable: Secondary | ICD-10-CM | POA: Insufficient documentation

## 2013-05-08 DIAGNOSIS — R0781 Pleurodynia: Secondary | ICD-10-CM

## 2013-05-08 DIAGNOSIS — R079 Chest pain, unspecified: Secondary | ICD-10-CM | POA: Insufficient documentation

## 2013-05-08 DIAGNOSIS — F17200 Nicotine dependence, unspecified, uncomplicated: Secondary | ICD-10-CM | POA: Insufficient documentation

## 2013-05-08 DIAGNOSIS — J45909 Unspecified asthma, uncomplicated: Secondary | ICD-10-CM | POA: Insufficient documentation

## 2013-05-08 DIAGNOSIS — W19XXXA Unspecified fall, initial encounter: Secondary | ICD-10-CM | POA: Insufficient documentation

## 2013-05-08 MED ORDER — HYDROCODONE-ACETAMINOPHEN 5-325 MG PO TABS: 2.0000 | ORAL_TABLET | Freq: Four times a day (QID) | ORAL | Status: DC | PRN

## 2013-05-08 MED ORDER — HYDROCODONE-ACETAMINOPHEN 5-325 MG PO TABS
2.0000 | ORAL_TABLET | Freq: Once | ORAL | Status: AC
  Administered 2013-05-08: 2 via ORAL
  Filled 2013-05-08: qty 2

## 2013-05-08 NOTE — ED Provider Notes (Signed)
History    This chart was scribed for Roxy Horseman, non-physician practitioner working with Celene Kras, MD by Leone Payor, ED Scribe. This patient was seen in room TR07C/TR07C and the patient's care was started at 1726.   CSN: [id number]  Arrival date & time 05/08/13  1726   First MD Initiated Contact with Patient 05/08/13 1822      Chief Complaint  Patient presents with  . Shortness of Breath     The history is provided by the patient. No language interpreter was used.    HPI Comments: Kristen Mcgee is a 27 y.o. female with h/o asthma who presents to the Emergency Department complaining of ongoing, gradually worsening episodes of SOB and R sided rib pains after a fall that occurred 2 months ago. She was seen at [facility] recently and left with a negative XRAY. She was prescribed Vicodin and an inhaler which she did not get filled. States she already had an inhaler at home and the Vicodin was too expensive. Reports no relief with inhaler use. Certain positions help alleviate the pain. Coughing aggravates the pain and SOB. She has been taking ibuprofen at home with minimal relief. She has had associated dry cough for which she has taken robitussin.    Past Medical History  Diagnosis Date  . Asthma     Past Surgical History  Procedure Laterality Date  . Renal biopsy    . Cesarean section      History reviewed. No pertinent family history.  History  Substance Use Topics  . Smoking status: Current Every Day Smoker -- 1.00 packs/day    Types: Cigarettes  . Smokeless tobacco: Not on file  . Alcohol Use: Yes    OB History   Grav Para Term Preterm Abortions TAB SAB Ect Mult Living   2 1 1  1 1  0   1      Review of Systems A complete 10 system review of systems was obtained and all systems are negative except as noted in the HPI and PMH.   Allergies  Keflex and Vancomycin  Home Medications   Current Outpatient Rx  Name  Route  Sig  Dispense  Refill   . albuterol (PROVENTIL HFA;VENTOLIN HFA) 108 (90 BASE) MCG/ACT inhaler   Inhalation   Inhale 1-2 puffs into the lungs every 6 (six) hours as needed for wheezing.   1 Inhaler   0   . HYDROcodone-acetaminophen (NORCO/VICODIN) 5-325 MG per tablet      1-2 tablets po q 6 hours prn moderate to severe pain   20 tablet   0   . ibuprofen (ADVIL,MOTRIN) 200 MG tablet   Oral   Take 600 mg by mouth every 4 (four) hours as needed. For pain         . oxyCODONE-acetaminophen (PERCOCET) 5-325 MG per tablet   Oral   Take 2 tablets by mouth every 4 (four) hours as needed for pain.   10 tablet   0   . penicillin v potassium (VEETID) 500 MG tablet   Oral   Take 1 tablet (500 mg total) by mouth 3 (three) times daily.   30 tablet   0   . penicillin v potassium (VEETID) 500 MG tablet   Oral   Take 500 mg by mouth 4 (four) times daily.           BP 126/63  Pulse 84  Temp(Src) 98.1 F (36.7 C) (Oral)  Resp 16  SpO2 100%  LMP 05/01/2013  Physical Exam  Nursing note and vitals reviewed. Constitutional: She is oriented to person, place, and time. She appears well-developed and well-nourished. No distress.  HENT:  Head: Normocephalic and atraumatic.  Eyes: EOM are normal.  Neck: Neck supple. No tracheal deviation present.  Cardiovascular: Normal rate.   Pulmonary/Chest: Effort normal and breath sounds normal. No respiratory distress. She has no wheezes. She has no rales. She exhibits tenderness.  R sided lower chest tenderness over inferior ribs anteriorly.   Musculoskeletal: Normal range of motion.  Neurological: She is alert and oriented to person, place, and time.  Skin: Skin is warm and dry.  Psychiatric: She has a normal mood and affect. Her behavior is normal.    ED Course  Procedures (including critical care time)  DIAGNOSTIC STUDIES: Oxygen Saturation is 100% on room air, normal by my interpretation.    COORDINATION OF CARE: 6:30 PM Discussed treatment plan with pt  at bedside and pt agreed to plan.   Labs Reviewed - No data to display Dg Chest 2 View  05/08/2013   *RADIOLOGY REPORT*  Clinical Data: Shortness of breath.  CHEST - 2 VIEW  Comparison: 05/03/2013  Findings: Lungs are clear and show no evidence of edema, infiltrate or pneumothorax.  No pleural fluid is identified.  Cardiac and mediastinal contours are within normal limits.  The bony thorax is unremarkable.  IMPRESSION: No active disease.   Original Report Authenticated By: Irish Lack, M.D.     1. Rib pain       MDM  Patient with rib pain.  Seen previously at [facility].  PERC negative.  No productive cough.  No pneumonia or pneumothorax.  Suspect occult rib fracture.  Will treat with pain medicine and give ortho follow-up.  Will give incentive spirometer.  Continue ice and albuterol.     I personally performed the services described in this documentation, which was scribed in my presence. The recorded information has been reviewed and is accurate.    Roxy Horseman, PA-C 05/08/13 2112

## 2013-05-08 NOTE — ED Notes (Signed)
Called patient in ED waiting room no answer. 

## 2013-05-08 NOTE — ED Notes (Signed)
Pt reports having fall two months ago and has been getting episodes of sob and right side rib pains. Went to [facility] recently and had xray done and was negative. Pt reports no relief with using her inhaler. Airway is intact, speaking in full sentences, spo2 100%

## 2013-05-08 NOTE — ED Notes (Signed)
Pt denies any questions upon discharge, verbalized understanding to no driving with prescriptions and medications given in ED.

## 2013-05-08 NOTE — ED Notes (Signed)
RT at bedside for incentive spirometry

## 2013-05-11 NOTE — ED Provider Notes (Signed)
Medical screening examination/treatment/procedure(s) were performed by non-physician practitioner and as supervising physician I was immediately available for consultation/collaboration.    Celene Kras, MD 05/11/13 [phone]

## 2013-05-24 ENCOUNTER — Encounter (HOSPITAL_BASED_OUTPATIENT_CLINIC_OR_DEPARTMENT_OTHER): Payer: Self-pay | Admitting: *Deleted

## 2013-05-24 ENCOUNTER — Emergency Department (HOSPITAL_BASED_OUTPATIENT_CLINIC_OR_DEPARTMENT_OTHER)
Admission: EM | Admit: 2013-05-24 | Discharge: 2013-05-24 | Disposition: A | Discharge status: Home / Self Care | Payer: Self-pay | Attending: Emergency Medicine | Admitting: Emergency Medicine

## 2013-05-24 DIAGNOSIS — D229 Melanocytic nevi, unspecified: Secondary | ICD-10-CM

## 2013-05-24 DIAGNOSIS — Z79899 Other long term (current) drug therapy: Secondary | ICD-10-CM | POA: Insufficient documentation

## 2013-05-24 DIAGNOSIS — D235 Other benign neoplasm of skin of trunk: Secondary | ICD-10-CM | POA: Insufficient documentation

## 2013-05-24 DIAGNOSIS — J45909 Unspecified asthma, uncomplicated: Secondary | ICD-10-CM | POA: Insufficient documentation

## 2013-05-24 DIAGNOSIS — F17200 Nicotine dependence, unspecified, uncomplicated: Secondary | ICD-10-CM | POA: Insufficient documentation

## 2013-05-24 NOTE — ED Notes (Signed)
MD at bedside. 

## 2013-05-24 NOTE — ED Provider Notes (Signed)
History    CSN: [id number] Arrival date & time 05/24/13  4098  First MD Initiated Contact with Patient 05/24/13 0530     Chief Complaint  Patient presents with  . Shoulder Pain   (Consider location/radiation/quality/duration/timing/severity/associated sxs/prior Treatment) Patient is a 27 y.o. female presenting with shoulder pain. The history is provided by the patient.  Shoulder Pain This is a new problem. The current episode started yesterday. The problem occurs constantly. The problem has not changed since onset.Pertinent negatives include no chest pain, no abdominal pain, no headaches and no shortness of breath. Nothing aggravates the symptoms. Nothing relieves the symptoms. She has tried nothing for the symptoms. The treatment provided no relief.  pain is at the site of a small mole which is bothering the patient.  Patient denies trauma.  No f/c/r.  No drainage Past Medical History  Diagnosis Date  . Asthma    Past Surgical History  Procedure Laterality Date  . Renal biopsy    . Cesarean section     History reviewed. No pertinent family history. History  Substance Use Topics  . Smoking status: Current Every Day Smoker -- 1.00 packs/day    Types: Cigarettes  . Smokeless tobacco: Not on file  . Alcohol Use: Yes   OB History   Grav Para Term Preterm Abortions TAB SAB Ect Mult Living   2 1 1  1 1  0   1     Review of Systems  Respiratory: Negative for shortness of breath.   Cardiovascular: Negative for chest pain.  Gastrointestinal: Negative for abdominal pain.  Neurological: Negative for headaches.  All other systems reviewed and are negative.    Allergies  Keflex and Vancomycin  Home Medications   Current Outpatient Rx  Name  Route  Sig  Dispense  Refill  . albuterol (PROVENTIL HFA;VENTOLIN HFA) 108 (90 BASE) MCG/ACT inhaler   Inhalation   Inhale 1-2 puffs into the lungs every 6 (six) hours as needed for wheezing or shortness of breath.         Marland Kitchen  HYDROcodone-acetaminophen (NORCO/VICODIN) 5-325 MG per tablet   Oral   Take 2 tablets by mouth every 6 (six) hours as needed for pain.   15 tablet   0   . ibuprofen (ADVIL,MOTRIN) 200 MG tablet   Oral   Take 600 mg by mouth every 4 (four) hours as needed for pain.           BP 115/82  Pulse 68  Temp(Src) 98.2 F (36.8 C) (Oral)  Resp 16  Ht 5\' 4"  (1.626 m)  Wt 141 lb (63.957 kg)  BMI 24.19 kg/m2  SpO2 100%  LMP 05/01/2013 Physical Exam  Constitutional: She is oriented to person, place, and time. She appears well-developed and well-nourished. No distress.  HENT:  Head: Normocephalic and atraumatic.  Mouth/Throat: Oropharynx is clear and moist.  Eyes: Conjunctivae and EOM are normal.  Neck: Normal range of motion. Neck supple.  Cardiovascular: Normal rate and regular rhythm.   Pulmonary/Chest: Effort normal and breath sounds normal.  Abdominal: Soft. Bowel sounds are normal.  Musculoskeletal: Normal range of motion.  Neurological: She is alert and oriented to person, place, and time.  Skin: Skin is warm and dry.     Psychiatric: She has a normal mood and affect.    ED Course  Procedures (including critical care time) Labs Reviewed - No data to display No results found. 1. Mole of skin     MDM  MOLE APPEARS TO  BE SLIGHTLY INFLAMED LIKELY FROM RUBBING ON [facility] OR CLOTHES  Jhordyn Hoopingarner K Kewanna Kasprzak-Rasch, MD 05/24/13 [phone]

## 2013-05-24 NOTE — ED Notes (Signed)
Pt has raised mole to right shoulder which is red and sore

## 2014-04-21 ENCOUNTER — Emergency Department (HOSPITAL_COMMUNITY)
Admission: EM | Admit: 2014-04-21 | Discharge: 2014-04-21 | Disposition: A | Discharge status: Home / Self Care | Payer: Self-pay | Attending: Emergency Medicine | Admitting: Emergency Medicine

## 2014-04-21 ENCOUNTER — Encounter (HOSPITAL_COMMUNITY): Payer: Self-pay | Admitting: Emergency Medicine

## 2014-04-21 ENCOUNTER — Emergency Department (HOSPITAL_COMMUNITY): Payer: Self-pay

## 2014-04-21 DIAGNOSIS — J40 Bronchitis, not specified as acute or chronic: Secondary | ICD-10-CM

## 2014-04-21 DIAGNOSIS — J45901 Unspecified asthma with (acute) exacerbation: Secondary | ICD-10-CM | POA: Insufficient documentation

## 2014-04-21 DIAGNOSIS — F17200 Nicotine dependence, unspecified, uncomplicated: Secondary | ICD-10-CM | POA: Insufficient documentation

## 2014-04-21 DIAGNOSIS — M546 Pain in thoracic spine: Secondary | ICD-10-CM | POA: Insufficient documentation

## 2014-04-21 MED ORDER — HYDROCODONE-ACETAMINOPHEN 7.5-325 MG/15ML PO SOLN: 10.0000 mL | Freq: Four times a day (QID) | ORAL | Status: DC | PRN

## 2014-04-21 MED ORDER — ALBUTEROL SULFATE HFA 108 (90 BASE) MCG/ACT IN AERS
2.0000 | INHALATION_SPRAY | RESPIRATORY_TRACT | Status: DC | PRN
  Administered 2014-04-21: 2 via RESPIRATORY_TRACT
  Filled 2014-04-21: qty 6.7

## 2014-04-21 MED ORDER — AZITHROMYCIN 250 MG PO TABS: 250.0000 mg | ORAL_TABLET | Freq: Every day | ORAL | Status: DC

## 2014-04-21 NOTE — Discharge Instructions (Signed)

## 2014-04-21 NOTE — ED Provider Notes (Signed)
Medical screening examination/treatment/procedure(s) were performed by non-physician practitioner and as supervising physician I was immediately available for consultation/collaboration.   EKG Interpretation None        Blanchie Dessert, MD 04/21/14 1507

## 2014-04-21 NOTE — ED Provider Notes (Signed)
CSN: [id number]     Arrival date & time 04/21/14  1230 History  This chart was scribed for non-physician practitioner, Domenic Moras, PA-C working with Blanchie Dessert, MD by Frederich Balding, ED scribe. This patient was seen in room TR07C/TR07C and the patient's care was started at 12:52 PM.   Chief Complaint  Patient presents with  . Cough  . Back Pain   The history is provided by the patient. No language interpreter was used.   HPI Comments: Kristen Mcgee is a 28 y.o. female with history of asthma who presents to the Emergency Department complaining of productive cough that started 2 weeks ago. States it has started causing myalgias in her back and ribs. Pt has also had congestion, SOB and sore throat. Denies rhinorrhea, sneezing, ear pain, rash. Pt smokes about half a pack of cigarettes per day. Denies past complications with her asthma. Denies history of pulmonary embolism or cancer. Pt does not currently take hormones. Denies recent surgery or long distance travel.   Past Medical History  Diagnosis Date  . Asthma    Past Surgical History  Procedure Laterality Date  . Renal biopsy    . Cesarean section     History reviewed. No pertinent family history. History  Substance Use Topics  . Smoking status: Current Every Day Smoker -- 1.00 packs/day    Types: Cigarettes  . Smokeless tobacco: Not on file  . Alcohol Use: Yes   OB History   Grav Para Term Preterm Abortions TAB SAB Ect Mult Living   2 1 1  1 1  0   1     Review of Systems  HENT: Positive for congestion and sore throat. Negative for ear pain, rhinorrhea and sneezing.   Respiratory: Positive for cough and shortness of breath.   Musculoskeletal: Positive for back pain and myalgias.  Skin: Negative for rash.  All other systems reviewed and are negative.  Allergies  Keflex and Vancomycin  Home Medications   Prior to Admission medications   Medication Sig Start Date End Date Taking? Authorizing Provider  ibuprofen  (ADVIL,MOTRIN) 200 MG tablet Take 600 mg by mouth every 4 (four) hours as needed for pain.    Yes Historical Provider, MD   BP 106/73  Pulse 80  Temp(Src) 97.7 F (36.5 C) (Oral)  Resp 18  Ht 5\' 5"  (1.651 m)  Wt 125 lb (56.7 kg)  BMI 20.80 kg/m2  SpO2 98%  Physical Exam  Nursing note and vitals reviewed. Constitutional: She is oriented to person, place, and time. She appears well-developed and well-nourished. No distress.  HENT:  Head: Normocephalic and atraumatic.  Mouth/Throat: Uvula is midline. Posterior oropharyngeal erythema present. No oropharyngeal exudate or posterior oropharyngeal edema.  Eyes: EOM are normal.  Neck: Neck supple. No tracheal deviation present.  Cardiovascular: Normal rate, regular rhythm and normal heart sounds.  Exam reveals no gallop and no friction rub.   No murmur heard. Pulmonary/Chest: Effort normal and breath sounds normal. No respiratory distress. She has no wheezes. She has no rhonchi. She has no rales.  Musculoskeletal: Normal range of motion.  Tenderness to upper back without focal point tenderness.   Neurological: She is alert and oriented to person, place, and time.  Skin: Skin is warm and dry.  Psychiatric: She has a normal mood and affect. Her behavior is normal.    ED Course  Procedures (including critical care time)  DIAGNOSTIC STUDIES: Oxygen Saturation is 98% on RA, normal by my interpretation.    COORDINATION  OF CARE: 12:55 PM-Discussed treatment plan which includes chest xray with pt at bedside and pt agreed to plan.   1:19 PM Xray without acute infiltrates concerning for pna.  Given significant smoking hx of persistent cough, will treat for bronchitis with zithromax and hycet.  Smoking cessation discussed.  Low suspicion for PE.  Labs Review Labs Reviewed - No data to display  Imaging Review Dg Chest 2 View  04/21/2014   CLINICAL DATA:  Cough, congestion, fever  EXAM: CHEST  2 VIEW  COMPARISON:  05/08/2013  FINDINGS:  Cardiomediastinal silhouette is stable. No acute infiltrate or pleural effusion. No pulmonary edema. Central mild bronchitic changes. Bony thorax is stable.  IMPRESSION: No acute infiltrate or pulmonary edema. Central mild bronchitic changes.   Electronically Signed   By: Lahoma Crocker M.D.   On: 04/21/2014 13:05     EKG Interpretation None      MDM   Final diagnoses:  Bronchitis    BP 106/73  Pulse 80  Temp(Src) 97.7 F (36.5 C) (Oral)  Resp 18  Ht 5\' 5"  (1.651 m)  Wt 125 lb (56.7 kg)  BMI 20.80 kg/m2  SpO2 98%  LMP 04/21/2014  I have reviewed nursing notes and vital signs. I personally reviewed the imaging tests through PACS system  I reviewed available ER/hospitalization records thought the EMR   I personally performed the services described in this documentation, which was scribed in my presence. The recorded information has been reviewed and is accurate.  Domenic Moras, PA-C 04/21/14 1320

## 2014-04-21 NOTE — ED Notes (Signed)
States has been coughing for 2 weeks. Has had loss of appetite but no N/V. States cough is violent at time. Now also having right flank pain, "I think I pulled a muscle". Pt is a smoker.

## 2014-04-21 NOTE — ED Notes (Signed)
Per pt sts has had a cough x 2 weeks. sts productive with some right rib and upper back pain.

## 2014-04-22 MED ORDER — DM-GUAIFENESIN ER 30-600 MG PO TB12: 1.0000 | ORAL_TABLET | Freq: Two times a day (BID) | ORAL | Status: DC | PRN

## 2014-09-16 ENCOUNTER — Encounter (HOSPITAL_COMMUNITY): Payer: Self-pay | Admitting: Emergency Medicine

## 2014-09-16 ENCOUNTER — Emergency Department (HOSPITAL_COMMUNITY)
Admission: EM | Admit: 2014-09-16 | Discharge: 2014-09-17 | Disposition: A | Discharge status: Home / Self Care | Payer: Self-pay | Attending: Emergency Medicine | Admitting: Emergency Medicine

## 2014-09-16 ENCOUNTER — Emergency Department (HOSPITAL_COMMUNITY)
Admission: EM | Admit: 2014-09-16 | Discharge: 2014-09-16 | Disposition: A | Discharge status: Home / Self Care | Payer: Self-pay | Attending: Emergency Medicine | Admitting: Emergency Medicine

## 2014-09-16 DIAGNOSIS — K0889 Other specified disorders of teeth and supporting structures: Secondary | ICD-10-CM

## 2014-09-16 DIAGNOSIS — B350 Tinea barbae and tinea capitis: Secondary | ICD-10-CM | POA: Insufficient documentation

## 2014-09-16 DIAGNOSIS — Z720 Tobacco use: Secondary | ICD-10-CM | POA: Insufficient documentation

## 2014-09-16 DIAGNOSIS — R509 Fever, unspecified: Secondary | ICD-10-CM | POA: Insufficient documentation

## 2014-09-16 DIAGNOSIS — J45901 Unspecified asthma with (acute) exacerbation: Secondary | ICD-10-CM | POA: Insufficient documentation

## 2014-09-16 DIAGNOSIS — K088 Other specified disorders of teeth and supporting structures: Secondary | ICD-10-CM | POA: Insufficient documentation

## 2014-09-16 DIAGNOSIS — J159 Unspecified bacterial pneumonia: Secondary | ICD-10-CM | POA: Insufficient documentation

## 2014-09-16 DIAGNOSIS — J189 Pneumonia, unspecified organism: Secondary | ICD-10-CM

## 2014-09-16 DIAGNOSIS — R0789 Other chest pain: Secondary | ICD-10-CM | POA: Insufficient documentation

## 2014-09-16 MED ORDER — IBUPROFEN 400 MG PO TABS
400.0000 mg | ORAL_TABLET | Freq: Once | ORAL | Status: AC
  Administered 2014-09-16: 400 mg via ORAL
  Filled 2014-09-16: qty 2

## 2014-09-16 MED ORDER — ALBUTEROL SULFATE (2.5 MG/3ML) 0.083% IN NEBU
5.0000 mg | INHALATION_SOLUTION | Freq: Once | RESPIRATORY_TRACT | Status: DC
  Filled 2014-09-16: qty 6

## 2014-09-16 MED ORDER — IPRATROPIUM BROMIDE 0.02 % IN SOLN
0.5000 mg | Freq: Once | RESPIRATORY_TRACT | Status: DC
  Filled 2014-09-16: qty 2.5

## 2014-09-16 MED ORDER — ACETAMINOPHEN 325 MG PO TABS
650.0000 mg | ORAL_TABLET | Freq: Once | ORAL | Status: AC
  Administered 2014-09-16: 650 mg via ORAL

## 2014-09-16 NOTE — ED Notes (Signed)
Pt returned to triage but states " I need to leave. My ride is about to go." Pt in NAD.

## 2014-09-16 NOTE — ED Notes (Signed)
Tylenol and ibuprofen given. Pt out to w/r to tell friends to take her child home, before she gets a breathing tx.

## 2014-09-16 NOTE — ED Notes (Signed)
Pt left. 

## 2014-09-16 NOTE — ED Notes (Signed)
Pt refused EKG.

## 2014-09-16 NOTE — ED Notes (Signed)
Pt arrived to the ED with a complaint of shortness of breath and lung pain.  Pt also has dental pain that she wishes to be seen for while she is here.  Pt states she has asthma.  Pt has an inhaler and has used it three to four times today.

## 2014-09-16 NOTE — ED Notes (Signed)
Pt back to triage room, "has been arguing with ride", states, "she needs to leave", encouraged to stay, pt aware "room is ready for pt", pending pt decision.

## 2014-09-16 NOTE — ED Notes (Signed)
Pt back to room ambulatory

## 2014-09-16 NOTE — ED Notes (Signed)
States, "my lungs hurt", "chest is sore to touch", denies CP. Also reports non-productive weak cough, fever and sob. Onset 2d ago. (denies: CP, nvd, dizziness), no meds PTA.

## 2014-09-16 NOTE — ED Notes (Signed)
Pt has not returned from w/r. Unable to find pt.

## 2014-09-16 NOTE — ED Provider Notes (Signed)
  Patient eloped from the emergency department before provider evaluation. I did not see or evaluate this patient. Triage note states nonproductive cough with fever and SOB x 2 days with chest soreness.  Pt given albuterol treatment in triage, but then decided to leave has her ride was leaving her.    BP 110/61  Pulse 116  Temp(Src) 99.2 F (37.3 C) (Oral)  Resp 18  Ht 5\' 4"  (1.626 m)  Wt 117 lb (53.071 kg)  BMI 20.07 kg/m2  SpO2 95%  LMP 08/26/2014    Abigail Butts, PA-C 09/16/14 2139

## 2014-09-17 ENCOUNTER — Emergency Department (HOSPITAL_COMMUNITY): Payer: Self-pay

## 2014-09-17 MED ORDER — IPRATROPIUM-ALBUTEROL 0.5-2.5 (3) MG/3ML IN SOLN
3.0000 mL | Freq: Once | RESPIRATORY_TRACT | Status: AC
Start: 2014-09-17 — End: 2014-09-17
  Administered 2014-09-17: 3 mL via RESPIRATORY_TRACT
  Filled 2014-09-17: qty 3

## 2014-09-17 MED ORDER — IPRATROPIUM-ALBUTEROL 0.5-2.5 (3) MG/3ML IN SOLN
3.0000 mL | Freq: Once | RESPIRATORY_TRACT | Status: AC
  Administered 2014-09-17: 3 mL via RESPIRATORY_TRACT
  Filled 2014-09-17: qty 3

## 2014-09-17 MED ORDER — GRISEOFULVIN MICROSIZE 500 MG PO TABS: 500.0000 mg | ORAL_TABLET | Freq: Every day | ORAL | Status: DC

## 2014-09-17 MED ORDER — HYDROCODONE-ACETAMINOPHEN 7.5-325 MG/15ML PO SOLN
10.0000 mL | Freq: Once | ORAL | Status: AC
  Administered 2014-09-17: 10 mL via ORAL
  Filled 2014-09-17: qty 15

## 2014-09-17 MED ORDER — PREDNISONE 20 MG PO TABS: 40.0000 mg | ORAL_TABLET | Freq: Every day | ORAL | Status: DC

## 2014-09-17 MED ORDER — AMOXICILLIN 500 MG PO CAPS
500.0000 mg | ORAL_CAPSULE | Freq: Once | ORAL | Status: AC
  Administered 2014-09-17: 500 mg via ORAL
  Filled 2014-09-17: qty 1

## 2014-09-17 MED ORDER — ALBUTEROL SULFATE HFA 108 (90 BASE) MCG/ACT IN AERS: 1.0000 | INHALATION_SPRAY | Freq: Four times a day (QID) | RESPIRATORY_TRACT | Status: DC | PRN

## 2014-09-17 MED ORDER — AMOXICILLIN 500 MG PO CAPS: 500.0000 mg | ORAL_CAPSULE | Freq: Three times a day (TID) | ORAL | Status: DC

## 2014-09-17 MED ORDER — PREDNISONE 20 MG PO TABS
60.0000 mg | ORAL_TABLET | Freq: Once | ORAL | Status: AC
  Administered 2014-09-17: 60 mg via ORAL
  Filled 2014-09-17: qty 3

## 2014-09-17 NOTE — ED Provider Notes (Signed)
Medical screening examination/treatment/procedure(s) were performed by non-physician practitioner and as supervising physician I was immediately available for consultation/collaboration.   EKG Interpretation None        Wynetta Fines, MD 09/17/14 [phone]

## 2014-09-17 NOTE — ED Provider Notes (Signed)
CSN: [id number]     Arrival date & time 09/16/14  2327 History   First MD Initiated Contact with Patient 09/16/14 2347     Chief Complaint  Patient presents with  . Shortness of Breath     (Consider location/radiation/quality/duration/timing/severity/associated sxs/prior Treatment) HPI Comments: Patient is a 28 year old female past medical history significant for tobacco abuse presenting to the emergency department for 2-3 days of cough chest tightness, nasal congestion, subjective fevers and chills. Alleviating factors: none. Aggravating factors: laying down night time. Medications tried prior to arrival: Ibuprofen, inhaler. Patient is also complaining of one week of central or temporal pain, described as a severe throbbing in nature. She is not have a dentist. No alleviating factors. Eating and drinking aggravate her pain. PERC negative. Denies any IVDA.     Patient is a 28 y.o. female presenting with shortness of breath.  Shortness of Breath Associated symptoms: cough and fever (subjective)   Associated symptoms: no abdominal pain, no chest pain and no vomiting     Past Medical History  Diagnosis Date  . Asthma    Past Surgical History  Procedure Laterality Date  . Renal biopsy    . Cesarean section     History reviewed. No pertinent family history. History  Substance Use Topics  . Smoking status: Current Every Day Smoker -- 1.00 packs/day    Types: Cigarettes  . Smokeless tobacco: Not on file  . Alcohol Use: Yes   OB History   Grav Para Term Preterm Abortions TAB SAB Ect Mult Living   2 1 1  1 1  0   1     Review of Systems  Constitutional: Positive for fever (subjective) and chills.  HENT: Positive for dental problem.   Respiratory: Positive for cough, chest tightness and shortness of breath.   Cardiovascular: Negative for chest pain and leg swelling.  Gastrointestinal: Negative for nausea, vomiting and abdominal pain.  All other systems reviewed and are  negative.     Allergies  Keflex and Vancomycin  Home Medications   Prior to Admission medications   Medication Sig Start Date End Date Taking? Authorizing Provider  albuterol (PROVENTIL HFA;VENTOLIN HFA) 108 (90 BASE) MCG/ACT inhaler Inhale 1-2 puffs into the lungs every 6 (six) hours as needed for wheezing or shortness of breath. 09/17/14   Treniece Holsclaw L Zabria Liss, PA-C  albuterol (PROVENTIL HFA;VENTOLIN HFA) 108 (90 BASE) MCG/ACT inhaler Inhale 1-2 puffs into the lungs every 6 (six) hours as needed for wheezing or shortness of breath. 09/17/14   Alaine Loughney L Doriana Mazurkiewicz, PA-C  amoxicillin (AMOXIL) 500 MG capsule Take 1 capsule (500 mg total) by mouth 3 (three) times daily. 09/17/14   Pravin Perezperez L Rowynn Mcweeney, PA-C  griseofulvin (GRIFULVIN V) 500 MG tablet Take 1 tablet (500 mg total) by mouth daily. 09/17/14   Quadasia Newsham L Donyetta Ogletree, PA-C  predniSONE (DELTASONE) 20 MG tablet Take 2 tablets (40 mg total) by mouth daily. 09/17/14   Bryanah Sidell L Izola Teague, PA-C   BP 103/71  Pulse 95  Temp(Src) 98.6 F (37 C) (Oral)  Resp 18  Ht 5\' 4"  (1.626 m)  Wt 117 lb (53.071 kg)  BMI 20.07 kg/m2  SpO2 95%  LMP 08/26/2014 Physical Exam  Nursing note and vitals reviewed. Constitutional: She is oriented to person, place, and time. She appears well-developed and well-nourished. No distress.  HENT:  Head: Normocephalic and atraumatic.  Right Ear: External ear normal.  Left Ear: External ear normal.  Nose: Nose normal.  Mouth/Throat: Uvula is midline, oropharynx is clear  and moist and mucous membranes are normal. No trismus in the jaw. Abnormal dentition. Dental caries present. No dental abscesses or uvula swelling. No tonsillar abscesses.    Submental and sublingual spaces are soft. Head with tinea capitis.   Eyes: Conjunctivae are normal.  Neck: Normal range of motion. Neck supple.  Cardiovascular: Normal rate, regular rhythm, normal heart sounds and intact distal pulses.   Pulmonary/Chest:  Effort normal. No respiratory distress. She has wheezes (mild expiratory). She exhibits tenderness. She exhibits no crepitus and no deformity.  Abdominal: Soft. There is no tenderness.  Musculoskeletal: Normal range of motion. She exhibits no edema.  Neurological: She is alert and oriented to person, place, and time.  Skin: Skin is warm and dry. She is not diaphoretic.  Psychiatric: She has a normal mood and affect.    ED Course  Procedures (including critical care time) Medications  HYDROcodone-acetaminophen (HYCET) 7.5-325 mg/15 ml solution 10 mL (10 mLs Oral Given 09/17/14 0033)  ipratropium-albuterol (DUONEB) 0.5-2.5 (3) MG/3ML nebulizer solution 3 mL (3 mLs Nebulization Given 09/17/14 0035)  predniSONE (DELTASONE) tablet 60 mg (60 mg Oral Given 09/17/14 0034)  ipratropium-albuterol (DUONEB) 0.5-2.5 (3) MG/3ML nebulizer solution 3 mL (3 mLs Nebulization Given 09/17/14 0113)  amoxicillin (AMOXIL) capsule 500 mg (500 mg Oral Given 09/17/14 0113)    Labs Review Labs Reviewed - No data to display  Imaging Review Dg Chest 2 View  09/17/2014   CLINICAL DATA:  28 year old female with history of asthma and chest pain. Smoker. Initial encounter.  EXAM: CHEST  2 VIEW  COMPARISON:  04/21/2014.  FINDINGS: Within the posterior aspect of the left upper lobe, 2.5 mm masslike consolidation. Given the fact that this has developed in a relatively short period of time in a 28 year old patient, this may represent infection or atelectasis. Malignancy cannot be excluded. This will need to be followed closely as this clears with next follow-up chest x-ray in 2 weeks. Result of pulmonary embolus also not excluded in the proper clinical setting.  No pneumothorax.  No plain film evidence of adenopathy.  Increased lung markings with minimal peribronchial thickening may represent changes of reactive airway disease/bronchitis.  Heart size within normal limits.  No osseous abnormality noted.  IMPRESSION: Within the  posterior aspect of the left upper lobe, 2.5 mm masslike consolidation. Given the fact that this has developed in a relatively short period of time in a 28 year old patient, this may represent infection or atelectasis. Malignancy cannot be excluded. This will need to be followed closely as this clears with next follow-up chest x-ray in 2 weeks. Result of pulmonary embolus also not excluded in the proper clinical setting.  Increased lung markings with minimal peribronchial thickening may represent changes of reactive airway disease/bronchitis.   Electronically Signed   By: Chauncey Cruel M.D.   On: 09/17/2014 00:33     EKG Interpretation None      1:06 AM Discussed Chest x-ray results with patient, discussed that we will treat for PNA given history of subjective fevers, chills, and cough, but advised patient that she will need to follow up for a repeat CXR whether with her PCP, an urgent care center, or back in the emergency department to ensure this is not a malignancy. She understands this and will return. She will be given a copy of her X-ray results.   MDM   Final diagnoses:  Chest tightness  CAP (community acquired pneumonia)  Tinea capitis  Pain, dental    Filed Vitals:   09/16/14 2334  BP: 103/71  Pulse: 95  Temp: 98.6 F (37 C)  Resp: 18   Afebrile, NAD, non-toxic appearing, AAOx4.   1) Cough: Patient with likely URI vs. PNA. PERC negative. Given CXR results with history and physical exam findings will treat for PNA, patient will be given Amoxicillin d/t monitory considerations. Will also treat with Prednisone and an inhaler. Extensive conversation regarding the need to have a repeat CXR in 2-3 weeks to ensure   2) Dental pain: Patient with toothache.  No gross abscess.  Exam unconcerning for Ludwig's angina or spread of infection.  Will treat with penicillin and pain medicine.  Urged patient to follow-up with dentist.    3) Tinea capitis: Will write griseofulvin.    Will  sign out to Charlann Lange, PA-C for re-evaluation after second duoneb.  Patient d/w with Dr. Florina Ou, agrees with plan.    Harlow Mares, PA-C 09/17/14 0123

## 2014-09-17 NOTE — Discharge Instructions (Signed)
Please follow up with your primary care physician in 1-2 days. If you do not have one please call the [facility] number listed above. Please make sure you followup with either her primary care doctor, in urgent care Center, where the emergency department in 2-3 weeks to ensure that the possible chest mass is clearing or if it is not for further evaluation. Please take your antibiotic until completion.  Please follow up with Dr. Radford Pax to schedule a follow up appointment. Please read all discharge instructions and return precautions.   Pneumonia Pneumonia is an infection of the lungs.  CAUSES Pneumonia may be caused by bacteria or a virus. Usually, these infections are caused by breathing infectious particles into the lungs (respiratory tract). SIGNS AND SYMPTOMS   Cough.  Fever.  Chest pain.  Increased rate of breathing.  Wheezing.  Mucus production. DIAGNOSIS  If you have the common symptoms of pneumonia, your health care provider will typically confirm the diagnosis with a chest X-ray. The X-ray will show an abnormality in the lung (pulmonary infiltrate) if you have pneumonia. Other tests of your blood, urine, or sputum may be done to find the specific cause of your pneumonia. Your health care provider may also do tests (blood gases or pulse oximetry) to see how well your lungs are working. TREATMENT  Some forms of pneumonia may be spread to other people when you cough or sneeze. You may be asked to wear a mask before and during your exam. Pneumonia that is caused by bacteria is treated with antibiotic medicine. Pneumonia that is caused by the influenza virus may be treated with an antiviral medicine. Most other viral infections must run their course. These infections will not respond to antibiotics.  HOME CARE INSTRUCTIONS   Cough suppressants may be used if you are losing too much rest. However, coughing protects you by clearing your lungs. You should avoid using  cough suppressants if you can.  Your health care provider may have prescribed medicine if he or she thinks your pneumonia is caused by bacteria or influenza. Finish your medicine even if you start to feel better.  Your health care provider may also prescribe an expectorant. This loosens the mucus to be coughed up.  Take medicines only as directed by your health care provider.  Do not smoke. Smoking is a common cause of bronchitis and can contribute to pneumonia. If you are a smoker and continue to smoke, your cough may last several weeks after your pneumonia has cleared.  A cold steam vaporizer or humidifier in your room or home may help loosen mucus.  Coughing is often worse at night. Sleeping in a semi-upright position in a recliner or using a couple pillows under your head will help with this.  Get rest as you feel it is needed. Your body will usually let you know when you need to rest. PREVENTION A pneumococcal shot (vaccine) is available to prevent a common bacterial cause of pneumonia. This is usually suggested for:  People over 69 years old.  Patients on chemotherapy.  People with chronic lung problems, such as bronchitis or emphysema.  People with immune system problems. If you are over 65 or have a high risk condition, you may receive the pneumococcal vaccine if you have not received it before. In some countries, a routine influenza vaccine is also recommended. This vaccine can help prevent some cases of pneumonia.You may be offered the influenza vaccine as part of your care. If you smoke,  it is time to quit. You may receive instructions on how to stop smoking. Your health care provider can provide medicines and counseling to help you quit. SEEK MEDICAL CARE IF: You have a fever. SEEK IMMEDIATE MEDICAL CARE IF:   Your illness becomes worse. This is especially true if you are elderly or weakened from any other disease.  You cannot control your cough with suppressants and are  losing sleep.  You begin coughing up blood.  You develop pain which is getting worse or is uncontrolled with medicines.  Any of the symptoms which initially brought you in for treatment are getting worse rather than better.  You develop shortness of breath or chest pain. MAKE SURE YOU:   Understand these instructions.  Will watch your condition.  Will get help right away if you are not doing well or get worse. Document Released: 11/17/2005 Document Revised: 04/03/2014 Document Reviewed: 02/06/2011 [facility] Patient Information 2015 [place], [place]. This information is not intended to replace advice given to you by your health care provider. Make sure you discuss any questions you have with your health care provider.  Ringworm of the Scalp Tinea Capitis is also called scalp ringworm. It is a fungal infection of the skin on the scalp seen mainly in children.  CAUSES  Scalp ringworm spreads from:  Other people.  Pets (cats and dogs) and animals.  Bedding, hats, combs or brushes shared with an infected person  Theater seats that an infected person sat in. SYMPTOMS  Scalp ringworm causes the following symptoms:  Flaky scales that look like dandruff.  Circles of thick, raised red skin.  Hair loss.  Red pimples or pustules.  Swollen glands in the back of the neck.  Itching. DIAGNOSIS  A skin scraping or infected hairs will be sent to test for fungus. Testing can be done either by looking under the microscope (KOH examination) or by doing a culture (test to try to grow the fungus). A culture can take up to 2 weeks to come back. TREATMENT   Scalp ringworm must be treated with medicine by mouth to kill the fungus for 6 to 8 weeks.  Medicated shampoos (ketoconazole or selenium sulfide shampoo) may be used to decrease the shedding of fungal spores from the scalp.  Steroid medicines are used for severe cases that are very inflamed in conjunction with antifungal  medication.  It is important that any family members or pets that have the fungus be treated. HOME CARE INSTRUCTIONS   Be sure to treat the rash completely - follow your caregiver's instructions. It can take a month or more to treat. If you do not treat it long enough, the rash can come back.  Watch for other cases in your family or pets.  Do not share brushes, combs, barrettes, or hats. Do not share towels.  Combs, brushes, and hats should be cleaned carefully and natural bristle brushes must be thrown away.  It is not necessary to shave the scalp or wear a hat during treatment.  Children may attend school once they start treatment with the oral medicine.  Be sure to follow up with your caregiver as directed to be sure the infection is gone. SEEK MEDICAL CARE IF:   Rash is worse.  Rash is spreading.  Rash returns after treatment is completed.  The rash is not better in 2 weeks with treatment. Fungal infections are slow to respond to treatment. Some redness may remain for several weeks after the fungus is gone. SEEK IMMEDIATE  MEDICAL CARE IF:  The area becomes red, warm, tender, and swollen.  Pus is oozing from the rash.  You or your child has an oral temperature above 102 F (38.9 C), not controlled by medicine. Document Released: 11/14/2000 Document Revised: 02/09/2012 Document Reviewed: 12/27/2008 [facility] Patient Information 2015 [place], [place]. This information is not intended to replace advice given to you by your health care provider. Make sure you discuss any questions you have with your health care provider.  Dental Pain A tooth ache may be caused by cavities (tooth decay). Cavities expose the nerve of the tooth to air and hot or cold temperatures. It may come from an infection or abscess (also called a boil or furuncle) around your tooth. It is also often caused by dental caries (tooth decay). This causes the pain you are having. DIAGNOSIS  Your caregiver can  diagnose this problem by exam. TREATMENT   If caused by an infection, it may be treated with medications which kill germs (antibiotics) and pain medications as prescribed by your caregiver. Take medications as directed.  Only take over-the-counter or prescription medicines for pain, discomfort, or fever as directed by your caregiver.  Whether the tooth ache today is caused by infection or dental disease, you should see your dentist as soon as possible for further care. SEEK MEDICAL CARE IF: The exam and treatment you received today has been provided on an emergency basis only. This is not a substitute for complete medical or dental care. If your problem worsens or new problems (symptoms) appear, and you are unable to meet with your dentist, call or return to this location. SEEK IMMEDIATE MEDICAL CARE IF:   You have a fever.  You develop redness and swelling of your face, jaw, or neck.  You are unable to open your mouth.  You have severe pain uncontrolled by pain medicine. MAKE SURE YOU:   Understand these instructions.  Will watch your condition.  Will get help right away if you are not doing well or get worse. Document Released: 11/17/2005 Document Revised: 02/09/2012 Document Reviewed: 07/05/2008 [facility] Patient Information 2015 [place], [place]. This information is not intended to replace advice given to you by your health care provider. Make sure you discuss any questions you have with your health care provider. Nicotine Addiction Nicotine can act as both a stimulant (excites/activates) and a sedative (calms/quiets). Immediately after exposure to nicotine, there is a "kick" caused in part by the drug's stimulation of the adrenal glands and resulting discharge of adrenaline (epinephrine). The rush of adrenaline stimulates the body and causes a sudden release of sugar. This means that smokers are always slightly hyperglycemic. Hyperglycemic means that the blood sugar is high, just like  in diabetics. Nicotine also decreases the amount of insulin which helps control sugar levels in the body. There is an increase in blood pressure, breathing, and the rate of heart beats.  In addition, nicotine indirectly causes a release of dopamine in the brain that controls pleasure and motivation. A similar reaction is seen with other drugs of abuse, such as cocaine and heroin. This dopamine release is thought to cause the pleasurable sensations when smoking. In some different cases, nicotine can also create a calming effect, depending on sensitivity of the smoker's nervous system and the dose of nicotine taken. WHAT HAPPENS WHEN NICOTINE IS TAKEN FOR LONG PERIODS OF TIME?  Long-term use of nicotine results in addiction. It is difficult to stop.  Repeated use of nicotine creates tolerance. Higher doses of nicotine  are needed to get the "kick." When nicotine use is stopped, withdrawal may last a month or more. Withdrawal may begin within a few hours after the last cigarette. Symptoms peak within the first few days and may lessen within a few weeks. For some people, however, symptoms may last for months or longer. Withdrawal symptoms include:   Irritability.  Craving.  Learning and attention deficits.  Sleep disturbances.  Increased appetite. Craving for tobacco may last for 6 months or longer. Many behaviors done while using nicotine can also play a part in the severity of withdrawal symptoms. For some people, the feel, smell, and sight of a cigarette and the ritual of obtaining, handling, lighting, and smoking the cigarette are closely linked with the pleasure of smoking. When stopped, they also miss the related behaviors which make the withdrawal or craving worse. While nicotine gum and patches may lessen the drug aspects of withdrawal, cravings often persist. WHAT ARE THE MEDICAL CONSEQUENCES OF NICOTINE USE?  Nicotine addiction accounts for one-third of all cancers. The top cancer caused by  tobacco is lung cancer. Lung cancer is the number one cancer killer of both men and women.  Smoking is also associated with cancers of the:  Mouth.  Pharynx.  Larynx.  Esophagus.  Stomach.  Pancreas.  Cervix.  Kidney.  Ureter.  Bladder.  Smoking also causes lung diseases such as lasting (chronic) bronchitis and emphysema.  It worsens asthma in adults and children.  Smoking increases the risk of heart disease, including:  Stroke.  Heart attack.  Vascular disease.  Aneurysm.  Passive or secondary smoke can also increase medical risks including:  Asthma in children.  Sudden Infant Death Syndrome (SIDS).  Additionally, dropped cigarettes are the leading cause of residential fire fatalities.  Nicotine poisoning has been reported from accidental ingestion of tobacco products by children and pets. Death usually results in a few minutes from respiratory failure (when a person stops breathing) caused by paralysis. TREATMENT   Medication. Nicotine replacement medicines such as nicotine gum and the patch are used to stop smoking. These medicines gradually lower the dosage of nicotine in the body. These medicines do not contain the carbon monoxide and other toxins found in tobacco smoke.  Hypnotherapy.  Relaxation therapy.  Nicotine Anonymous (a 12-step support program). Find times and locations in your local yellow pages. Document Released: 07/23/2004 Document Revised: 02/09/2012 Document Reviewed: 01/13/2014 [facility] Patient Information 2015 [place], [place]. This information is not intended to replace advice given to you by your health care provider. Make sure you discuss any questions you have with your health care provider.

## 2014-09-17 NOTE — ED Provider Notes (Signed)
Medical screening examination/treatment/procedure(s) were performed by non-physician practitioner and as supervising physician I was immediately available for consultation/collaboration.  Richarda Blade, MD 09/17/14 [phone]

## 2014-09-17 NOTE — ED Notes (Signed)
Computer would not allow me to log in in room, pt was in a hurry to leave.  D/c instructions and follow up and all meds discussed with pt.  Pt reports that she is feeling much better after meds and breathing treatments

## 2014-09-18 ENCOUNTER — Emergency Department (HOSPITAL_COMMUNITY): Payer: Self-pay

## 2014-09-18 ENCOUNTER — Encounter (HOSPITAL_COMMUNITY): Payer: Self-pay | Admitting: Emergency Medicine

## 2014-09-18 ENCOUNTER — Emergency Department (HOSPITAL_COMMUNITY)
Admission: EM | Admit: 2014-09-18 | Discharge: 2014-09-18 | Discharge status: Against Medical Advice | Payer: Self-pay | Attending: Emergency Medicine | Admitting: Emergency Medicine

## 2014-09-18 DIAGNOSIS — Z79899 Other long term (current) drug therapy: Secondary | ICD-10-CM | POA: Insufficient documentation

## 2014-09-18 DIAGNOSIS — Z720 Tobacco use: Secondary | ICD-10-CM | POA: Insufficient documentation

## 2014-09-18 DIAGNOSIS — J189 Pneumonia, unspecified organism: Secondary | ICD-10-CM

## 2014-09-18 DIAGNOSIS — Z3202 Encounter for pregnancy test, result negative: Secondary | ICD-10-CM | POA: Insufficient documentation

## 2014-09-18 DIAGNOSIS — J159 Unspecified bacterial pneumonia: Secondary | ICD-10-CM | POA: Insufficient documentation

## 2014-09-18 DIAGNOSIS — J45909 Unspecified asthma, uncomplicated: Secondary | ICD-10-CM | POA: Insufficient documentation

## 2014-09-18 LAB — CBC
HCT: 43.5 % (ref 36.0–46.0)
Hemoglobin: 14.8 g/dL (ref 12.0–15.0)
MCH: 30.6 pg (ref 26.0–34.0)
MCHC: 34 g/dL (ref 30.0–36.0)
MCV: 89.9 fL (ref 78.0–100.0)
PLATELETS: 203 10*3/uL (ref 150–400)
RBC: 4.84 MIL/uL (ref 3.87–5.11)
RDW: 13.6 % (ref 11.5–15.5)
WBC: 10.8 10*3/uL — ABNORMAL HIGH (ref 4.0–10.5)

## 2014-09-18 LAB — BASIC METABOLIC PANEL
ANION GAP: 18 — AB (ref 5–15)
BUN: 14 mg/dL (ref 6–23)
CALCIUM: 9.8 mg/dL (ref 8.4–10.5)
CO2: 22 meq/L (ref 19–32)
CREATININE: 0.64 mg/dL (ref 0.50–1.10)
Chloride: 97 mEq/L (ref 96–112)
GFR calc Af Amer: >90 mL/min (ref >90)
Glucose, Bld: 102 mg/dL — ABNORMAL HIGH (ref 70–99)
Potassium: 4 mEq/L (ref 3.7–5.3)
Sodium: 137 mEq/L (ref 137–147)

## 2014-09-18 LAB — D-DIMER, QUANTITATIVE: D-Dimer, Quant: 2.25 ug/mL-FEU — ABNORMAL HIGH (ref 0.00–0.48)

## 2014-09-18 LAB — I-STAT TROPONIN, ED: Troponin i, poc: 0 ng/mL (ref 0.00–0.08)

## 2014-09-18 LAB — URINALYSIS, ROUTINE W REFLEX MICROSCOPIC
Bilirubin Urine: NEGATIVE
GLUCOSE, UA: NEGATIVE mg/dL
Ketones, ur: NEGATIVE mg/dL
Nitrite: POSITIVE — AB
PH: 5.5 (ref 5.0–8.0)
Protein, ur: 30 mg/dL — AB
SPECIFIC GRAVITY, URINE: 1.024 (ref 1.005–1.030)
Urobilinogen, UA: 1 mg/dL (ref 0.0–1.0)

## 2014-09-18 LAB — POC URINE PREG, ED: PREG TEST UR: NEGATIVE

## 2014-09-18 LAB — URINE MICROSCOPIC-ADD ON

## 2014-09-18 MED ORDER — MORPHINE SULFATE 4 MG/ML IJ SOLN
4.0000 mg | Freq: Once | INTRAMUSCULAR | Status: DC
  Filled 2014-09-18: qty 1

## 2014-09-18 MED ORDER — ALBUTEROL SULFATE (2.5 MG/3ML) 0.083% IN NEBU
5.0000 mg | INHALATION_SOLUTION | Freq: Once | RESPIRATORY_TRACT | Status: DC
  Filled 2014-09-18 (×2): qty 6

## 2014-09-18 MED ORDER — LEVOFLOXACIN IN D5W 750 MG/150ML IV SOLN
750.0000 mg | Freq: Once | INTRAVENOUS | Status: DC
  Filled 2014-09-18: qty 150

## 2014-09-18 MED ORDER — IOHEXOL 350 MG/ML SOLN: 100.0000 mL | Freq: Once | INTRAVENOUS | Status: DC | PRN

## 2014-09-18 MED ORDER — IPRATROPIUM-ALBUTEROL 0.5-2.5 (3) MG/3ML IN SOLN: 3.0000 mL | Freq: Once | RESPIRATORY_TRACT | Status: DC

## 2014-09-18 MED ORDER — ONDANSETRON 4 MG PO TBDP
4.0000 mg | ORAL_TABLET | Freq: Once | ORAL | Status: DC
Start: 2014-09-18 — End: 2014-09-19
  Filled 2014-09-18: qty 1

## 2014-09-18 MED ORDER — IPRATROPIUM BROMIDE 0.02 % IN SOLN
0.5000 mg | Freq: Once | RESPIRATORY_TRACT | Status: DC
  Filled 2014-09-18 (×2): qty 2.5

## 2014-09-18 MED ORDER — IPRATROPIUM BROMIDE 0.02 % IN SOLN: 0.5000 mg | Freq: Once | RESPIRATORY_TRACT | Status: DC

## 2014-09-18 NOTE — ED Notes (Signed)
Pt has not returned to room 

## 2014-09-18 NOTE — ED Notes (Signed)
Pt up at nurses station yelling and cursing about being in pain and not having received anything for that pain. I tried to calm pt and reassure her that her nurse would be in her room with medication shortly. Pt unable to be calmed, states she is going to walk out to the parking lot. RN and MD made aware.

## 2014-09-18 NOTE — ED Provider Notes (Addendum)
CSN: [id number]     Arrival date & time 09/18/14  1558 History   First MD Initiated Contact with Patient 09/18/14 1936     Chief Complaint  Patient presents with  . Chest Pain     (Consider location/radiation/quality/duration/timing/severity/associated sxs/prior Treatment) HPI Comments: Patient complains of constant left-sided chest pain x1 week. Seen here and diagnosed with pneumonia and placed on antibiotics for which she has been compliant. Continues to note dyspnea on exertion as well as right-sided pleuritic pain. Denies any leg pain or swelling. No anginal type symptoms. No fever or chills. Nonproductive cough noted. Symptoms better with rest. Denies any wheezing. No prior history of pulmonary embolism or DVT. Denies any use of oral contraceptives.  Patient is a 28 y.o. female presenting with chest pain. The history is provided by the patient.  Chest Pain   Past Medical History  Diagnosis Date  . Asthma    Past Surgical History  Procedure Laterality Date  . Renal biopsy    . Cesarean section     History reviewed. No pertinent family history. History  Substance Use Topics  . Smoking status: Current Every Day Smoker -- 1.00 packs/day    Types: Cigarettes  . Smokeless tobacco: Not on file  . Alcohol Use: Yes   OB History   Grav Para Term Preterm Abortions TAB SAB Ect Mult Living   2 1 1  1 1  0   1     Review of Systems  Cardiovascular: Positive for chest pain.  All other systems reviewed and are negative.     Allergies  Keflex and Vancomycin  Home Medications   Prior to Admission medications   Medication Sig Start Date End Date Taking? Authorizing Provider  albuterol (PROVENTIL HFA;VENTOLIN HFA) 108 (90 BASE) MCG/ACT inhaler Inhale 1-2 puffs into the lungs every 6 (six) hours as needed for wheezing or shortness of breath. 09/17/14  Yes Jennifer L Piepenbrink, PA-C  amoxicillin (AMOXIL) 500 MG capsule Take 500 mg by mouth 3 (three) times daily. Started 09-17-14  for 7 days   Yes Historical Provider, MD  predniSONE (DELTASONE) 20 MG tablet Take 40 mg by mouth 2 (two) times daily. Started 09-17-14 for 7 days   Yes Historical Provider, MD   BP 105/69  Pulse 91  Temp(Src) 98.2 F (36.8 C) (Oral)  Resp 16  SpO2 95%  LMP 08/26/2014 Physical Exam  Nursing note and vitals reviewed. Constitutional: She is oriented to person, place, and time. She appears well-developed and well-nourished.  Non-toxic appearance. No distress.  HENT:  Head: Normocephalic and atraumatic.  Eyes: Conjunctivae, EOM and lids are normal. Pupils are equal, round, and reactive to light.  Neck: Normal range of motion. Neck supple. No tracheal deviation present. No mass present.  Cardiovascular: Normal rate, regular rhythm and normal heart sounds.  Exam reveals no gallop.   No murmur heard. Pulmonary/Chest: Effort normal and breath sounds normal. No stridor. No respiratory distress. She has no decreased breath sounds. She has no wheezes. She has no rhonchi. She has no rales.  Abdominal: Soft. Normal appearance and bowel sounds are normal. She exhibits no distension. There is no tenderness. There is no rebound and no CVA tenderness.  Musculoskeletal: Normal range of motion. She exhibits no edema and no tenderness.  Neurological: She is alert and oriented to person, place, and time. She has normal strength. No cranial nerve deficit or sensory deficit. GCS eye subscore is 4. GCS verbal subscore is 5. GCS motor subscore is 6.  Skin: Skin is warm and dry. No abrasion and no rash noted.  Psychiatric: She has a normal mood and affect. Her speech is normal and behavior is normal.    ED Course  Procedures (including critical care time) Labs Review Labs Reviewed  CBC - Abnormal; Notable for the following:    WBC 10.8 (*)    All other components within normal limits  BASIC METABOLIC PANEL - Abnormal; Notable for the following:    Glucose, Bld 102 (*)    Anion gap 18 (*)    All other  components within normal limits  D-DIMER, QUANTITATIVE - Abnormal; Notable for the following:    D-Dimer, Quant 2.25 (*)    All other components within normal limits  URINALYSIS, ROUTINE W REFLEX MICROSCOPIC - Abnormal; Notable for the following:    Color, Urine AMBER (*)    APPearance CLOUDY (*)    Hgb urine dipstick SMALL (*)    Protein, ur 30 (*)    Nitrite POSITIVE (*)    Leukocytes, UA LARGE (*)    All other components within normal limits  URINE MICROSCOPIC-ADD ON - Abnormal; Notable for the following:    Bacteria, UA FEW (*)    Casts HYALINE CASTS (*)    Crystals CA OXALATE CRYSTALS (*)    All other components within normal limits  I-STAT TROPOININ, ED  POC URINE PREG, ED    Imaging Review Dg Chest 2 View (if Patient Has Fever And/or Copd)  09/18/2014   CLINICAL DATA:  28 year old female with left side chest pain last week. Subsequent acute onset right side chest pain yesterday a radiating to axilla. Shortness of breath and nausea. Initial encounter.  EXAM: CHEST  2 VIEW  COMPARISON:  09/17/2014 and earlier.  FINDINGS: Posterior left upper lung 25 mm nodular opacity persists. Interval increased similar appearing nodular and patchy opacity in the right lower lung (favor the lower lobe but uncertain). No associated pneumothorax, pulmonary edema or pleural effusion. Normal cardiac size and mediastinal contours. Visualized tracheal air column is within normal limits. No osseous abnormality identified.  IMPRESSION: Progression of patchy airspace disease, now bilateral. Favor bilateral pneumonia. Post treatment radiographs recommended to document resolution.   Electronically Signed   By: Lars Pinks M.D.   On: 09/18/2014 18:26   Dg Chest 2 View  09/17/2014   CLINICAL DATA:  28 year old female with history of asthma and chest pain. Smoker. Initial encounter.  EXAM: CHEST  2 VIEW  COMPARISON:  04/21/2014.  FINDINGS: Within the posterior aspect of the left upper lobe, 2.5 mm masslike  consolidation. Given the fact that this has developed in a relatively short period of time in a 28 year old patient, this may represent infection or atelectasis. Malignancy cannot be excluded. This will need to be followed closely as this clears with next follow-up chest x-ray in 2 weeks. Result of pulmonary embolus also not excluded in the proper clinical setting.  No pneumothorax.  No plain film evidence of adenopathy.  Increased lung markings with minimal peribronchial thickening may represent changes of reactive airway disease/bronchitis.  Heart size within normal limits.  No osseous abnormality noted.  IMPRESSION: Within the posterior aspect of the left upper lobe, 2.5 mm masslike consolidation. Given the fact that this has developed in a relatively short period of time in a 28 year old patient, this may represent infection or atelectasis. Malignancy cannot be excluded. This will need to be followed closely as this clears with next follow-up chest x-ray in 2 weeks. Result of pulmonary  embolus also not excluded in the proper clinical setting.  Increased lung markings with minimal peribronchial thickening may represent changes of reactive airway disease/bronchitis.   Electronically Signed   By: Chauncey Cruel M.D.   On: 09/17/2014 00:33     EKG Interpretation   Date/Time:  Monday September 18 2014 16:50:06 EDT Ventricular Rate:  90 PR Interval:  107 QRS Duration: 90 QT Interval:  363 QTC Calculation: 444 R Axis:   89 Text Interpretation:  Sinus rhythm Short PR interval RSR' in V1 or V2,  probably normal variant Confirmed by Demetrios Byron  MD, Etola Mull ([id number]) on  09/18/2014 7:39:00 PM      MDM   Final diagnoses:  None    Patient had breathing treatment ordered as well as chest CT to rule out pulmonary embolism. IV antibiotics were also ordered. Patient requested to go to the lobby area to arrange for childcare and she did not return.    Leota Jacobsen, MD 09/18/14 [id number]  Leota Jacobsen,  MD 09/18/14 [phone]

## 2014-09-18 NOTE — ED Notes (Signed)
Pt not in room at this time

## 2014-09-18 NOTE — ED Notes (Signed)
Dr Zenia Resides told pt she could go out to parking lot to speak to visitor about picking up her kids for her, then she will come back to room.

## 2014-09-18 NOTE — ED Notes (Addendum)
Pt reports she has had L side chest pain for past week. Had chest x ray Saturday which reveal "dime sized spot" on her lung. Pt states she began to have R side chest pain yesterday that radiates to armpit. Pt reports sob, slight nausea. No dizziness. Pain worse with inspiration.

## 2014-10-02 ENCOUNTER — Encounter (HOSPITAL_COMMUNITY): Payer: Self-pay | Admitting: Emergency Medicine

## 2017-01-24 ENCOUNTER — Emergency Department (HOSPITAL_COMMUNITY)
Admission: EM | Admit: 2017-01-24 | Discharge: 2017-01-24 | Disposition: A | Discharge status: Home / Self Care | Payer: Self-pay | Attending: Emergency Medicine | Admitting: Emergency Medicine

## 2017-01-24 ENCOUNTER — Encounter (HOSPITAL_COMMUNITY): Payer: Self-pay

## 2017-01-24 DIAGNOSIS — Z79899 Other long term (current) drug therapy: Secondary | ICD-10-CM | POA: Insufficient documentation

## 2017-01-24 DIAGNOSIS — S81851A Open bite, right lower leg, initial encounter: Secondary | ICD-10-CM | POA: Insufficient documentation

## 2017-01-24 DIAGNOSIS — Y999 Unspecified external cause status: Secondary | ICD-10-CM | POA: Insufficient documentation

## 2017-01-24 DIAGNOSIS — Y929 Unspecified place or not applicable: Secondary | ICD-10-CM | POA: Insufficient documentation

## 2017-01-24 DIAGNOSIS — Y939 Activity, unspecified: Secondary | ICD-10-CM | POA: Insufficient documentation

## 2017-01-24 DIAGNOSIS — F17210 Nicotine dependence, cigarettes, uncomplicated: Secondary | ICD-10-CM | POA: Insufficient documentation

## 2017-01-24 DIAGNOSIS — W57XXXA Bitten or stung by nonvenomous insect and other nonvenomous arthropods, initial encounter: Secondary | ICD-10-CM | POA: Insufficient documentation

## 2017-01-24 DIAGNOSIS — J45909 Unspecified asthma, uncomplicated: Secondary | ICD-10-CM | POA: Insufficient documentation

## 2017-01-24 MED ORDER — PERMETHRIN 5 % EX CREA: TOPICAL_CREAM | CUTANEOUS | 1 refills | Status: DC

## 2017-01-24 MED ORDER — HYDROXYZINE HCL 10 MG PO TABS: 10.0000 mg | ORAL_TABLET | Freq: Four times a day (QID) | ORAL | 0 refills | Status: DC | PRN

## 2017-01-24 NOTE — ED Provider Notes (Signed)
[facility] DEPT Provider Note   CSN: [id number] Arrival date & time: 01/24/17  1725  By signing my name below, I, [name], attest that this documentation has been prepared under the direction and in the presence of Montine Circle, PA-C.  Electronically Signed: [name], ED Scribe. 01/24/17. 5:42 PM.  History   Chief Complaint Chief Complaint  Patient presents with  . Insect Bite    HPI Kristen Mcgee is a 31 y.o. female with a PMHX of asthma, who presents to the Emergency Department complaining of a diffuse pruritic rash for the last couple of weeks, since buying used clothing. Pt states she believes the clothing she bought was infested with lice or bed bugs, noting that she has been consistently seeing tiny black bugs on her body and her clothes since the purchase. Per pt, she has changed her mattress, washed her clothes and thrown out the recently purchased items. She has also tried OTC bed bug and lice treatments and has shaved her arms, with no success. No worsening factors noted. She denies fever, and any additional complaints.   The history is provided by the patient. No language interpreter was used.    Past Medical History:  Diagnosis Date  . Asthma     There are no active problems to display for this patient.   Past Surgical History:  Procedure Laterality Date  . CESAREAN SECTION    . RENAL BIOPSY      OB History    Gravida Para Term Preterm AB Living   2 1 1   1 1    SAB TAB Ectopic Multiple Live Births   0 1     1       Home Medications    Prior to Admission medications   Medication Sig Start Date End Date Taking? Authorizing Provider  albuterol (PROVENTIL HFA;VENTOLIN HFA) 108 (90 BASE) MCG/ACT inhaler Inhale 1-2 puffs into the lungs every 6 (six) hours as needed for wheezing or shortness of breath. 09/17/14   Jennifer Piepenbrink, PA-C  amoxicillin (AMOXIL) 500 MG capsule Take 500 mg by mouth 3 (three) times daily. Started 09-17-14  for 7 days    Historical Provider, MD  hydrOXYzine (ATARAX/VISTARIL) 10 MG tablet Take 1 tablet (10 mg total) by mouth every 6 (six) hours as needed for itching. 01/24/17   Montine Circle, PA-C  permethrin (ELIMITE) 5 % cream Apply to entire body other than face - let sit for 14 hours then wash off, may repeat in 1 week if still having symptoms 01/24/17   Montine Circle, PA-C  predniSONE (DELTASONE) 20 MG tablet Take 40 mg by mouth 2 (two) times daily. Started 09-17-14 for 7 days    Historical Provider, MD    Family History History reviewed. No pertinent family history.  Social History Social History  Substance Use Topics  . Smoking status: Current Every Day Smoker    Packs/day: 1.00    Types: Cigarettes  . Smokeless tobacco: Never Used  . Alcohol use Yes     Allergies   Keflex [cephalexin] and Vancomycin   Review of Systems Review of Systems  Constitutional: Negative for fever.  Skin: Positive for rash.    Physical Exam Updated Vital Signs BP 119/73   Pulse 97   Temp 98.4 F (36.9 C)   Resp 18   LMP 01/17/2017 (Approximate)   SpO2 98%   Physical Exam  Constitutional: She appears well-developed and well-nourished. No distress.  HENT:  Head: Normocephalic and atraumatic.  Eyes: Conjunctivae  are normal.  Neck: Normal range of motion.  Cardiovascular: Normal rate.   Pulmonary/Chest: Effort normal.  Abdominal: She exhibits no distension.  Musculoskeletal: Normal range of motion.  Neurological: She is alert.  Skin: No pallor.  Several scattered small bug bites with minimal surrounding erythema. No significant cellulitis or abscess.   Psychiatric: She has a normal mood and affect. Her behavior is normal.  Nursing note and vitals reviewed.    ED Treatments / Results  DIAGNOSTIC STUDIES: Oxygen Saturation is 98% on RA, normal by my interpretation.   COORDINATION OF CARE: 6:08 PM-Discussed next steps with pt which includes permethrin cream. Pt verbalized  understanding and is agreeable with the plan.    Labs (all labs ordered are listed, but only abnormal results are displayed) Labs Reviewed - No data to display  EKG  EKG Interpretation None       Radiology No results found.  Procedures Procedures (including critical care time)  Medications Ordered in ED Medications - No data to display   Initial Impression / Assessment and Plan / ED Course  I have reviewed the triage vital signs and the nursing notes.  Pertinent labs & imaging results that were available during my care of the patient were reviewed by me and considered in my medical decision making (see chart for details).     Patient with scattered bug bites.  Question bedbugs vs scabies.  Treat with permethrin and atarax.  Recommend she call an exterminator.  Final Clinical Impressions(s) / ED Diagnoses   Final diagnoses:  Insect bite, initial encounter    New Prescriptions New Prescriptions   HYDROXYZINE (ATARAX/VISTARIL) 10 MG TABLET    Take 1 tablet (10 mg total) by mouth every 6 (six) hours as needed for itching.   PERMETHRIN (ELIMITE) 5 % CREAM    Apply to entire body other than face - let sit for 14 hours then wash off, may repeat in 1 week if still having symptoms   I personally performed the services described in this documentation, which was scribed in my presence. The recorded information has been reviewed and is accurate.       Montine Circle, PA-C 01/24/17 [place], MD 01/24/17 [phone]

## 2017-01-24 NOTE — ED Triage Notes (Signed)
Pt complaining of little black bugs on skin since buying used clothes. Pt states attempted lice treatment, no improvement. Pt states wound to back of R leg since bugs.

## 2017-01-24 NOTE — ED Triage Notes (Signed)
PT reports seeing bugs and bug bites after she brought home cloths from [place]. No bugs seen on Pt during exam. I gave Pt blue scrubs and socks to change into at DC.

## 2017-01-24 NOTE — ED Notes (Signed)
Declined W/C at D/C and was escorted to lobby by RN. 

## 2017-03-19 ENCOUNTER — Emergency Department (HOSPITAL_COMMUNITY)
Admission: EM | Admit: 2017-03-19 | Discharge: 2017-03-19 | Disposition: A | Discharge status: Home / Self Care | Payer: Self-pay | Attending: Emergency Medicine | Admitting: Emergency Medicine

## 2017-03-19 ENCOUNTER — Emergency Department (HOSPITAL_COMMUNITY): Payer: Self-pay

## 2017-03-19 ENCOUNTER — Encounter (HOSPITAL_COMMUNITY): Payer: Self-pay | Admitting: Emergency Medicine

## 2017-03-19 DIAGNOSIS — N83201 Unspecified ovarian cyst, right side: Secondary | ICD-10-CM

## 2017-03-19 DIAGNOSIS — J45909 Unspecified asthma, uncomplicated: Secondary | ICD-10-CM | POA: Insufficient documentation

## 2017-03-19 DIAGNOSIS — B9689 Other specified bacterial agents as the cause of diseases classified elsewhere: Secondary | ICD-10-CM

## 2017-03-19 DIAGNOSIS — N760 Acute vaginitis: Secondary | ICD-10-CM | POA: Insufficient documentation

## 2017-03-19 DIAGNOSIS — R102 Pelvic and perineal pain: Secondary | ICD-10-CM

## 2017-03-19 DIAGNOSIS — F17210 Nicotine dependence, cigarettes, uncomplicated: Secondary | ICD-10-CM | POA: Insufficient documentation

## 2017-03-19 LAB — URINALYSIS, ROUTINE W REFLEX MICROSCOPIC
Bilirubin Urine: NEGATIVE
Glucose, UA: NEGATIVE mg/dL
Hgb urine dipstick: NEGATIVE
Ketones, ur: NEGATIVE mg/dL
Leukocytes, UA: NEGATIVE
Nitrite: NEGATIVE
Protein, ur: NEGATIVE mg/dL
Specific Gravity, Urine: 1.012 (ref 1.005–1.030)
pH: 6 (ref 5.0–8.0)

## 2017-03-19 LAB — PREGNANCY, URINE: Preg Test, Ur: NEGATIVE

## 2017-03-19 LAB — WET PREP, GENITAL
Sperm: NONE SEEN
Trich, Wet Prep: NONE SEEN
Yeast Wet Prep HPF POC: NONE SEEN

## 2017-03-19 MED ORDER — ONDANSETRON HCL 4 MG/2ML IJ SOLN
4.0000 mg | Freq: Once | INTRAMUSCULAR | Status: DC
  Filled 2017-03-19: qty 2

## 2017-03-19 MED ORDER — TRAMADOL HCL 50 MG PO TABS: 50.0000 mg | ORAL_TABLET | Freq: Four times a day (QID) | ORAL | 0 refills | Status: DC | PRN

## 2017-03-19 MED ORDER — NICOTINE 7 MG/24HR TD PT24
7.0000 mg | MEDICATED_PATCH | Freq: Once | TRANSDERMAL | Status: DC
  Administered 2017-03-19: 7 mg via TRANSDERMAL
  Filled 2017-03-19 (×2): qty 1

## 2017-03-19 MED ORDER — MORPHINE SULFATE (PF) 4 MG/ML IV SOLN
4.0000 mg | Freq: Once | INTRAVENOUS | Status: DC
  Filled 2017-03-19: qty 1

## 2017-03-19 MED ORDER — METRONIDAZOLE 500 MG PO TABS: 500.0000 mg | ORAL_TABLET | Freq: Two times a day (BID) | ORAL | 0 refills | Status: DC

## 2017-03-19 MED ORDER — HYDROMORPHONE HCL 1 MG/ML IJ SOLN
1.0000 mg | Freq: Once | INTRAMUSCULAR | Status: AC
Start: 2017-03-19 — End: 2017-03-19
  Administered 2017-03-19: 1 mg via INTRAMUSCULAR
  Filled 2017-03-19: qty 1

## 2017-03-19 MED ORDER — OXYCODONE-ACETAMINOPHEN 5-325 MG PO TABS
1.0000 | ORAL_TABLET | Freq: Once | ORAL | Status: AC
  Administered 2017-03-19: 1 via ORAL
  Filled 2017-03-19: qty 1

## 2017-03-19 NOTE — ED Provider Notes (Signed)
[facility] DEPT Provider Note   CSN: [id number] Arrival date & time: 03/19/17  0906     History   Chief Complaint Chief Complaint  Patient presents with  . Abdominal Pain    HPI Kristen Mcgee is a 31 y.o. female.  HPI Kristen Mcgee is a 31 y.o. female presents to emergency department with right lower abdominal pain onset yesterday. Patient states pain lasted all night and worsened this morning which prompted her to come here. States pain is only in the right side, it does not radiate. She denies any urinary symptoms. She states that she thought it may have been a yeast infection and used Monistat cream with no relief. She denies any vaginal discharge or bleeding. Last menstrual cycle was one week ago. She reports pains as sharp and cramping. She states "feels like labor." Pains are constant but come and go in severity. Reports associated nausea, no vomiting. No fever. Any movement and palpating of the abdomen making pain worse. Nothing has made it better. She has not tried any medications to help her with pain. She reports prior C-section, no other prior abdominal surgeries.    Past Medical History:  Diagnosis Date  . Asthma     There are no active problems to display for this patient.   Past Surgical History:  Procedure Laterality Date  . CESAREAN SECTION    . RENAL BIOPSY      OB History    Gravida Para Term Preterm AB Living   2 1 1   1 1    SAB TAB Ectopic Multiple Live Births   0 1     1       Home Medications    Prior to Admission medications   Medication Sig Start Date End Date Taking? Authorizing Provider  albuterol (PROVENTIL HFA;VENTOLIN HFA) 108 (90 BASE) MCG/ACT inhaler Inhale 1-2 puffs into the lungs every 6 (six) hours as needed for wheezing or shortness of breath. 09/17/14   Jennifer Piepenbrink, PA-C  amoxicillin (AMOXIL) 500 MG capsule Take 500 mg by mouth 3 (three) times daily. Started 09-17-14 for 7 days    Historical Provider, MD    hydrOXYzine (ATARAX/VISTARIL) 10 MG tablet Take 1 tablet (10 mg total) by mouth every 6 (six) hours as needed for itching. 01/24/17   Montine Circle, PA-C  permethrin (ELIMITE) 5 % cream Apply to entire body other than face - let sit for 14 hours then wash off, may repeat in 1 week if still having symptoms 01/24/17   Montine Circle, PA-C  predniSONE (DELTASONE) 20 MG tablet Take 40 mg by mouth 2 (two) times daily. Started 09-17-14 for 7 days    Historical Provider, MD    Family History History reviewed. No pertinent family history.  Social History Social History  Substance Use Topics  . Smoking status: Current Every Day Smoker    Packs/day: 1.00    Types: Cigarettes  . Smokeless tobacco: Never Used  . Alcohol use Yes     Allergies   Keflex [cephalexin] and Vancomycin   Review of Systems Review of Systems  Constitutional: Negative for chills and fever.  Respiratory: Negative for cough, chest tightness and shortness of breath.   Cardiovascular: Negative for chest pain, palpitations and leg swelling.  Gastrointestinal: Positive for abdominal pain and nausea. Negative for diarrhea and vomiting.  Genitourinary: Positive for pelvic pain. Negative for dysuria, flank pain, vaginal bleeding, vaginal discharge and vaginal pain.  Musculoskeletal: Negative for arthralgias, myalgias, neck pain and neck stiffness.  Skin: Negative for rash.  Neurological: Negative for dizziness, weakness and headaches.  All other systems reviewed and are negative.    Physical Exam Updated Vital Signs BP 115/87 (BP Location: Left Arm)   Pulse 87   Temp 97.7 F (36.5 C) (Oral)   Resp 18   SpO2 98%   Physical Exam  Constitutional: She is oriented to person, place, and time. She appears well-developed and well-nourished. No distress.  HENT:  Head: Normocephalic.  Eyes: Conjunctivae are normal.  Neck: Neck supple.  Cardiovascular: Normal rate, regular rhythm and normal heart sounds.    Pulmonary/Chest: Effort normal and breath sounds normal. No respiratory distress. She has no wheezes. She has no rales.  Abdominal: Soft. Bowel sounds are normal. She exhibits no distension. There is tenderness. There is no rebound.  RLQ tenderness, guarding  Genitourinary:  Genitourinary Comments: Normal external genitalia. Normal vaginal canal with white thin discharge. Cervix is normal, some cervical motion tenderness noted. Uterine and right adnexal tenderness. No masses palpated.  Musculoskeletal: She exhibits no edema.  Neurological: She is alert and oriented to person, place, and time.  Skin: Skin is warm and dry.  Psychiatric: She has a normal mood and affect. Her behavior is normal.  Nursing note and vitals reviewed.    ED Treatments / Results  Labs (all labs ordered are listed, but only abnormal results are displayed) Labs Reviewed  WET PREP, GENITAL  CBC WITH DIFFERENTIAL/PLATELET  COMPREHENSIVE METABOLIC PANEL  URINALYSIS, ROUTINE W REFLEX MICROSCOPIC  I-STAT BETA HCG BLOOD, ED ([facility], [facility], [facility] ONLY)  GC/CHLAMYDIA PROBE AMP ([place]) NOT AT [facility]    EKG  EKG Interpretation None       Radiology No results found.  Procedures Procedures (including critical care time)  Medications Ordered in ED Medications  morphine 4 MG/ML injection 4 mg (not administered)  ondansetron (ZOFRAN) injection 4 mg (not administered)     Initial Impression / Assessment and Plan / ED Course  I have reviewed the triage vital signs and the nursing notes.  Pertinent labs & imaging results that were available during my care of the patient were reviewed by me and considered in my medical decision making (see chart for details).     Patient with right lower quadrant abdominal pain, onset yesterday. She has associated nausea, no other symptoms. Will check pregnancy test, will get lab work, urinalysis, will perform pelvic exam. P medications ordered. Vital signs are normal. Patient does  have some guarding with palpation of the right lower quadrant. Differential includes appendicitis, ovarian cyst, ovarian torsion, ectopic pregnancy, or tubo-ovarian abscess.  11:35 AM Difficulty obtaining IV access. I will give her 1 mg of Dilaudid IM for pain until able to get access.    Final Clinical Impressions(s) / ED Diagnoses   Final diagnoses:  None    New Prescriptions New Prescriptions   No medications on file     Jeannett Senior, PA-C 03/19/17 [place], DO 03/19/17 1140

## 2017-03-19 NOTE — ED Triage Notes (Signed)
Pt sts RLQ pain starting last night

## 2017-03-19 NOTE — Discharge Instructions (Signed)
Please read attached information. If you experience any new or worsening signs or symptoms please return to the emergency room for evaluation. Please follow-up with your primary care provider or specialist as discussed. Please use medication prescribed only as directed and discontinue taking if you have any concerning signs or symptoms.  Please follow-up with repeat ultrasound in 6 weeks.

## 2017-03-19 NOTE — ED Notes (Signed)
IV team at bedside 

## 2017-03-19 NOTE — ED Provider Notes (Signed)
31 year old female signed out to me at shift change by previous provider. Please see her note for full H&P. History includes a one-day course of right lower quadrant abdominal pain. No radiation of symptoms. Laboratory analysis ordered, CT abdomen and pelvis ordered. Patient's IV infiltrated unable to obtain blood. Numerous sticks by nursing staff and IV team were unsuccessful. Patient is adamant that she will not let anyone attempted a needle stick for blood or IV access. I discussed with her that if we're unable to obtain IV access we would not be able to perform necessary imaging. Also without blood work we would have incomplete picture of her abdominal pain. Patient is insisting that no more sticks would be performed. CT abdomen and pelvis without contrast will be ordered. My exam patient does have exquisite tenderness to palpation of the right lower quadrant. She is afebrile and nontoxic with no tachycardia and no acute distress.  Eyes: The patient's room as patient wanted to leave the hospital. She reports she wants to go smoke and walk around. I informed her that she is more than welcome to leave but we would not be able to continue her care knowing she is outside of the hospital facility. Patient would need to leave [facility]. Patient is very upset that she cannot go outside and walk around. I informed her that the CT scan has been ordered and that she must stay within the hospital in order to continue her care. Patient will be given a nicotine patch here and has agreed to stay.  Patient's CT shows likely ovarian cyst. Ultrasound shows no acute signs of torsion. Patient's pain improved here with above medications. She is well-appearing with no other acute findings. At this point I feel she is safe for discharge and outpatient follow-up. Patient will be pain medication, medication for bacterial vaginosis. Patient was informed that she will need repeat ultrasound in 6 weeks for ongoing  evaluation. Refer to [facility]. Patient instructed follow-up with primary care provider for hypertension. She is given strict return precautions, verbalized understanding and agreement to today's plan had no further questions or concerns  Vitals:   03/19/17 1602 03/19/17 1638  BP: (!) 190/74 (!) 185/72  Pulse: 71 72  Resp: 16 16  Temp:         Okey Regal, PA-C 03/19/17 [place], DO 03/21/17 [phone]

## 2017-03-19 NOTE — Progress Notes (Signed)
First PIV started in left anterior forearm with #20G X1 inch via basilic vein.  Brisk blood return and flushed 10cc NS easily.   IV Team consulted a few minutes later to come start another IV due to the one Iin place was swollen and pt c/o pain with infusion.     Attempted 2 times with U/S with pt permission for 2nd attempt.  Unsuccessful.  Went above previous IV site and attempted as well as left A/C due to CT scan has been ordered.   Primary RN made aware.

## 2017-03-19 NOTE — ED Notes (Signed)
Patient at u/s 

## 2017-03-19 NOTE — ED Notes (Signed)
Patient refusing to have any more sticks. Refusing further attempts for IV or blood draws.

## 2017-03-19 NOTE — ED Notes (Signed)
Patient at CT

## 2017-03-20 LAB — GC/CHLAMYDIA PROBE AMP (~~LOC~~) NOT AT ARMC
Chlamydia: NEGATIVE
NEISSERIA GONORRHEA: NEGATIVE

## 2017-04-06 ENCOUNTER — Encounter (INDEPENDENT_AMBULATORY_CARE_PROVIDER_SITE_OTHER): Payer: Self-pay | Admitting: Physician Assistant

## 2017-04-06 ENCOUNTER — Ambulatory Visit (INDEPENDENT_AMBULATORY_CARE_PROVIDER_SITE_OTHER): Payer: Self-pay

## 2017-04-06 VITALS — BP 110/73 | HR 87 | Temp 97.6°F | Ht 63.0 in | Wt 190.8 lb

## 2017-04-06 DIAGNOSIS — Z79899 Other long term (current) drug therapy: Secondary | ICD-10-CM

## 2017-04-06 DIAGNOSIS — F3130 Bipolar disorder, current episode depressed, mild or moderate severity, unspecified: Secondary | ICD-10-CM

## 2017-04-06 DIAGNOSIS — R631 Polydipsia: Secondary | ICD-10-CM

## 2017-04-06 DIAGNOSIS — I713 Abdominal aortic aneurysm, ruptured, unspecified: Secondary | ICD-10-CM

## 2017-04-06 DIAGNOSIS — F319 Bipolar disorder, unspecified: Secondary | ICD-10-CM

## 2017-04-06 DIAGNOSIS — R3589 Other polyuria: Secondary | ICD-10-CM

## 2017-04-06 DIAGNOSIS — K0889 Other specified disorders of teeth and supporting structures: Secondary | ICD-10-CM

## 2017-04-06 DIAGNOSIS — Z720 Tobacco use: Secondary | ICD-10-CM

## 2017-04-06 DIAGNOSIS — J4520 Mild intermittent asthma, uncomplicated: Secondary | ICD-10-CM

## 2017-04-06 DIAGNOSIS — R635 Abnormal weight gain: Secondary | ICD-10-CM

## 2017-04-06 DIAGNOSIS — R358 Other polyuria: Secondary | ICD-10-CM

## 2017-04-06 LAB — POCT URINE PREGNANCY: PREG TEST UR: NEGATIVE

## 2017-04-06 LAB — POCT GLYCOSYLATED HEMOGLOBIN (HGB A1C): HEMOGLOBIN A1C: 5.4

## 2017-04-06 LAB — GLUCOSE, POCT (MANUAL RESULT ENTRY): POC GLUCOSE: 108 mg/dL — AB (ref 70–99)

## 2017-04-06 MED ORDER — NICOTINE 7 MG/24HR TD PT24: 7.0000 mg | MEDICATED_PATCH | Freq: Every day | TRANSDERMAL | 0 refills | Status: AC

## 2017-04-06 MED ORDER — DOXYCYCLINE HYCLATE 100 MG PO TABS: 100.0000 mg | ORAL_TABLET | Freq: Two times a day (BID) | ORAL | 0 refills | Status: AC

## 2017-04-06 MED ORDER — QUETIAPINE FUMARATE 25 MG PO TABS: 25.0000 mg | ORAL_TABLET | Freq: Every day | ORAL | 0 refills | Status: DC

## 2017-04-06 MED ORDER — ALBUTEROL SULFATE HFA 108 (90 BASE) MCG/ACT IN AERS: 1.0000 | INHALATION_SPRAY | Freq: Four times a day (QID) | RESPIRATORY_TRACT | 2 refills | Status: DC | PRN

## 2017-04-06 MED ORDER — QUETIAPINE FUMARATE 25 MG PO TABS: 25.0000 mg | ORAL_TABLET | Freq: Every day | ORAL | 0 refills | Status: AC

## 2017-04-06 MED ORDER — ALBUTEROL SULFATE HFA 108 (90 BASE) MCG/ACT IN AERS
1.0000 | INHALATION_SPRAY | Freq: Four times a day (QID) | RESPIRATORY_TRACT | 2 refills | Status: AC | PRN
Start: 2017-04-06 — End: ?

## 2017-04-06 MED ORDER — NICOTINE 7 MG/24HR TD PT24: 7.0000 mg | MEDICATED_PATCH | Freq: Every day | TRANSDERMAL | 0 refills | Status: DC

## 2017-04-06 MED ORDER — DOXYCYCLINE HYCLATE 100 MG PO TABS: 100.0000 mg | ORAL_TABLET | Freq: Two times a day (BID) | ORAL | 0 refills | Status: DC

## 2017-04-06 MED FILL — DOXYCYCLINE 100 MG TABLET: 100 | 10 days supply | Qty: 20 | Fill #0

## 2017-04-06 MED FILL — ?QUETIAPINE FUMARATE 25 MG: 25 MG | 30 days supply | Qty: 30 | Fill #0

## 2017-04-06 MED FILL — !VENTOLIN HFA INHALER: 108 (90 BAS | 25 days supply | Qty: 18 | Fill #0

## 2017-04-06 NOTE — Progress Notes (Signed)
Subjective:  Patient ID: Kristen Mcgee, female    DOB: Mar 28, 1986  Age: 31 y.o. MRN: [id number]  CC: Toothache and hospital f/u   HPI Kristen Mcgee is a 31 y.o. female with a PMH of Bipolar depression and asthma presents on hospital f/u of BV RLQ pain. Patient took Flagyl and BV cleared and RLQ pain has resolved. Main complaint is right upper tooth ache with maxillary sinus pain. She would like a dental referral. She also wants to be checked for diabetes because of bilateral LE tingling, polydipsia, polyuria, fatigue, family hx of DM. Denies visual blurring.     In regards to bipolar depression, patient has not seen a psychiatrist since she was in jail. Was in jail last year for 8 months. Tried several medications for Bipolar disorder but patient refused to take them because of lower extremity and weight gain.     Requests an albuterol inhaler due to seasonal allergies. Worse during pollen season. Currently having one exacerbation per week.     Smoking since 31 yo. Smokes a pack and a half per day. Would be willing to try nicotine patches.    Outpatient Medications Prior to Visit  Medication Sig Dispense Refill  . Ibuprofen (MIDOL) 200 MG CAPS Take 200 mg by mouth every 6 (six) hours as needed (pain).    Marland Kitchen albuterol (PROVENTIL HFA;VENTOLIN HFA) 108 (90 BASE) MCG/ACT inhaler Inhale 1-2 puffs into the lungs every 6 (six) hours as needed for wheezing or shortness of breath. (Patient not taking: Reported on 04/06/2017) 1 Inhaler 0  . hydrOXYzine (ATARAX/VISTARIL) 10 MG tablet Take 1 tablet (10 mg total) by mouth every 6 (six) hours as needed for itching. (Patient not taking: Reported on 03/19/2017) 30 tablet 0  . metroNIDAZOLE (FLAGYL) 500 MG tablet Take 1 tablet (500 mg total) by mouth 2 (two) times daily. 14 tablet 0  . permethrin (ELIMITE) 5 % cream Apply to entire body other than face - let sit for 14 hours then wash off, may repeat in 1 week if still having symptoms (Patient not taking:  Reported on 03/19/2017) 60 g 1  . traMADol (ULTRAM) 50 MG tablet Take 1 tablet (50 mg total) by mouth every 6 (six) hours as needed. (Patient not taking: Reported on 04/06/2017) 15 tablet 0   No facility-administered medications prior to visit.      ROS Review of Systems  Constitutional: Negative for chills, fever and malaise/fatigue.  HENT:       Toothache  Eyes: Negative for blurred vision.  Respiratory: Positive for shortness of breath.   Cardiovascular: Negative for chest pain and palpitations.  Gastrointestinal: Negative for abdominal pain and nausea.  Genitourinary: Negative for dysuria and hematuria.  Musculoskeletal: Negative for joint pain and myalgias.  Skin: Negative for rash.  Neurological: Negative for tingling and headaches.  Endo/Heme/Allergies: Positive for polydipsia.  Psychiatric/Behavioral: Negative for depression. The patient is not nervous/anxious.        Bipolar depression    Objective:  BP 110/73 (BP Location: Left Arm, Patient Position: Sitting, Cuff Size: Normal)   Pulse 87   Temp 97.6 F (36.4 C) (Oral)   Ht 5\' 3"  (1.6 m)   Wt 190 lb 12.8 oz (86.5 kg)   LMP 03/24/2017 (Approximate)   SpO2 96%   BMI 33.80 kg/m   BP/Weight 04/06/2017 03/19/2017 0/34/7425  Systolic BP 956 387 564  Diastolic BP 73 72 73  Wt. (Lbs) 190.8 - -  BMI 33.8 - -      Physical  Exam  Constitutional: She is oriented to person, place, and time.  Well developed, overweight, NAD, polite  HENT:  Head: Normocephalic and atraumatic.  Extremely poor dentition with missing and severely decayed teeth. Extensive periodontal disease.   Eyes: No scleral icterus.  Neck: Normal range of motion.  Cardiovascular: Normal rate, regular rhythm and normal heart sounds.   Pulmonary/Chest: Effort normal and breath sounds normal.  Abdominal: Soft.  Musculoskeletal: She exhibits no edema.  Neurological: She is alert and oriented to person, place, and time.  Skin: Skin is warm and dry. No rash  noted. No erythema. No pallor.  Psychiatric: She has a normal mood and affect. Her behavior is normal. Thought content normal.  Vitals reviewed.    Assessment & Plan:   1. Polydipsia - HgB A1c 5.4% in clinic today - Glucose (CBG) 108 in clinic today - Comprehensive metabolic panel - TSH  2. Polyuria - HgB A1c 5.4% in clinic today - Glucose (CBG) 108 in clinic today - Comprehensive metabolic panel - TSH  3. Mild intermittent asthma without complication - Refill Albuterol inhaler  4. Bipolar depression ([place]) - Ambulatory referral to Psychiatry - Begin QUEtiapine (SEROQUEL) 25 MG tablet; Take 1 tablet (25 mg total) by mouth at bedtime.  Dispense: 30 tablet; Refill: 0  5. Toothache - Ambulatory referral to Dentistry - CBC with Differential - doxycycline (VIBRA-TABS) 100 MG tablet; Take 1 tablet (100 mg total) by mouth 2 (two) times daily.  Dispense: 20 tablet; Refill: 0  6. Weight gain - Comprehensive metabolic panel - TSH  7. Tobacco abuse - Begin nicotine (NICODERM CQ - DOSED IN MG/24 HR) 7 mg/24hr patch; Place 1 patch (7 mg total) onto the skin daily.  Dispense: 28 patch; Refill: 0  8. High risk medication use - POCT urine pregnancy negative in clinic today   Meds ordered this encounter  Medications  . DISCONTD: QUEtiapine (SEROQUEL) 25 MG tablet    Sig: Take 1 tablet (25 mg total) by mouth at bedtime.    Dispense:  30 tablet    Refill:  0    Order Specific Question:   Supervising Provider    Answer:   Tresa Garter [id number]  . DISCONTD: albuterol (PROVENTIL HFA;VENTOLIN HFA) 108 (90 Base) MCG/ACT inhaler    Sig: Inhale 1-2 puffs into the lungs every 6 (six) hours as needed for wheezing or shortness of breath.    Dispense:  1 Inhaler    Refill:  2    Order Specific Question:   Supervising Provider    Answer:   Tresa Garter [id number]  . DISCONTD: doxycycline (VIBRA-TABS) 100 MG tablet    Sig: Take 1 tablet (100 mg total) by mouth 2 (two) times  daily.    Dispense:  20 tablet    Refill:  0    Order Specific Question:   Supervising Provider    Answer:   Tresa Garter [id number]  . DISCONTD: nicotine (NICODERM CQ - DOSED IN MG/24 HR) 7 mg/24hr patch    Sig: Place 1 patch (7 mg total) onto the skin daily.    Dispense:  28 patch    Refill:  0    Order Specific Question:   Supervising Provider    Answer:   Tresa Garter [id number]  . albuterol (PROVENTIL HFA;VENTOLIN HFA) 108 (90 Base) MCG/ACT inhaler    Sig: Inhale 1-2 puffs into the lungs every 6 (six) hours as needed for wheezing or shortness of breath.    Dispense:  1 Inhaler    Refill:  2    Order Specific Question:   Supervising Provider    Answer:   Tresa Garter [id number]  . doxycycline (VIBRA-TABS) 100 MG tablet    Sig: Take 1 tablet (100 mg total) by mouth 2 (two) times daily.    Dispense:  20 tablet    Refill:  0    Order Specific Question:   Supervising Provider    Answer:   Tresa Garter [id number]  . nicotine (NICODERM CQ - DOSED IN MG/24 HR) 7 mg/24hr patch    Sig: Place 1 patch (7 mg total) onto the skin daily.    Dispense:  28 patch    Refill:  0    Order Specific Question:   Supervising Provider    Answer:   Tresa Garter [id number]  . QUEtiapine (SEROQUEL) 25 MG tablet    Sig: Take 1 tablet (25 mg total) by mouth at bedtime.    Dispense:  30 tablet    Refill:  0    Order Specific Question:   Supervising Provider    Answer:   Tresa Garter [id number]    Follow-up: 4 weeks for Bipolar depression.  Clent Demark PA

## 2017-04-07 LAB — CBC WITH DIFFERENTIAL/PLATELET
BASOS ABS: 0 10*3/uL (ref 0.0–0.2)
Basos: 0 %
EOS (ABSOLUTE): 0.1 10*3/uL (ref 0.0–0.4)
Eos: 2 %
HEMATOCRIT: 41.4 % (ref 34.0–46.6)
Hemoglobin: 14.1 g/dL (ref 11.1–15.9)
Immature Grans (Abs): 0 10*3/uL (ref 0.0–0.1)
Immature Granulocytes: 0 %
Lymphocytes Absolute: 2.2 10*3/uL (ref 0.7–3.1)
Lymphs: 33 %
MCH: 31.8 pg (ref 26.6–33.0)
MCHC: 34.1 g/dL (ref 31.5–35.7)
MCV: 94 fL (ref 79–97)
MONOS ABS: 0.4 10*3/uL (ref 0.1–0.9)
Monocytes: 6 %
NEUTROS ABS: 3.9 10*3/uL (ref 1.4–7.0)
Neutrophils: 59 %
Platelets: 231 10*3/uL (ref 150–379)
RBC: 4.43 x10E6/uL (ref 3.77–5.28)
RDW: 13.4 % (ref 12.3–15.4)
WBC: 6.7 10*3/uL (ref 3.4–10.8)

## 2017-04-07 LAB — COMPREHENSIVE METABOLIC PANEL
ALK PHOS: 65 IU/L (ref 39–117)
ALT: 16 IU/L (ref 0–32)
AST: 20 IU/L (ref 0–40)
Albumin/Globulin Ratio: 1.4 (ref 1.2–2.2)
Albumin: 4.3 g/dL (ref 3.5–5.5)
BILIRUBIN TOTAL: 0.4 mg/dL (ref 0.0–1.2)
BUN / CREAT RATIO: 13 (ref 9–23)
BUN: 10 mg/dL (ref 6–20)
CHLORIDE: 100 mmol/L (ref 96–106)
CO2: 22 mmol/L (ref 18–29)
Calcium: 9.2 mg/dL (ref 8.7–10.2)
Creatinine, Ser: 0.77 mg/dL (ref 0.57–1.00)
GFR calc Af Amer: 118 mL/min/{1.73_m2} (ref >59)
GFR calc non Af Amer: 102 mL/min/{1.73_m2} (ref >59)
GLOBULIN, TOTAL: 3.1 g/dL (ref 1.5–4.5)
GLUCOSE: 96 mg/dL (ref 65–99)
Potassium: 4.1 mmol/L (ref 3.5–5.2)
Sodium: 139 mmol/L (ref 134–144)
Total Protein: 7.4 g/dL (ref 6.0–8.5)

## 2017-04-07 LAB — TSH: TSH: 1.6 u[IU]/mL (ref 0.450–4.500)

## 2017-04-30 ENCOUNTER — Ambulatory Visit (INDEPENDENT_AMBULATORY_CARE_PROVIDER_SITE_OTHER): Payer: Self-pay | Admitting: Physician Assistant

## 2017-05-01 IMAGING — US US ART/VEN ABD/PELV/SCROTUM DOPPLER LTD
1 series · 13 of 25 positions shown · non-contrast
Comparison: CT from earlier today

CLINICAL DATA: Pelvic pain for 1 day. Nausea and vomiting for 1
day.



[Series 1: us art/ven abd/pelv/scrotum doppler ltd · 0.21mm/px · 13 of 87 slices shown]
[im 1/87]
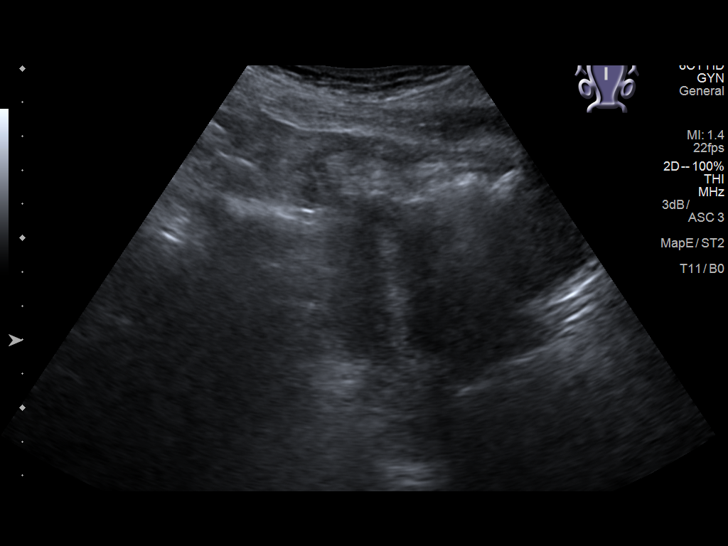
[im 8/87]
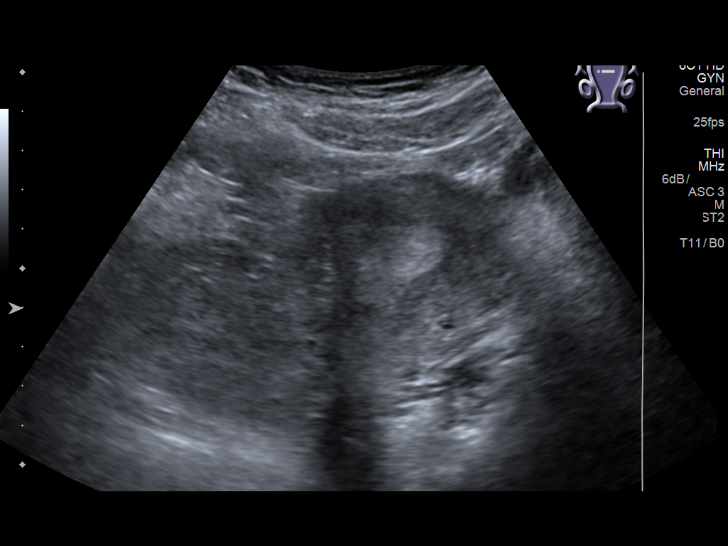
[im 15/87]
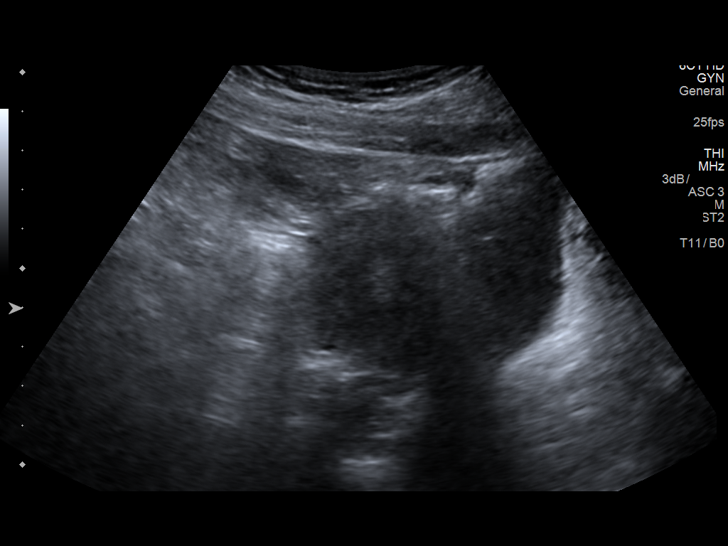
[im 22/87]
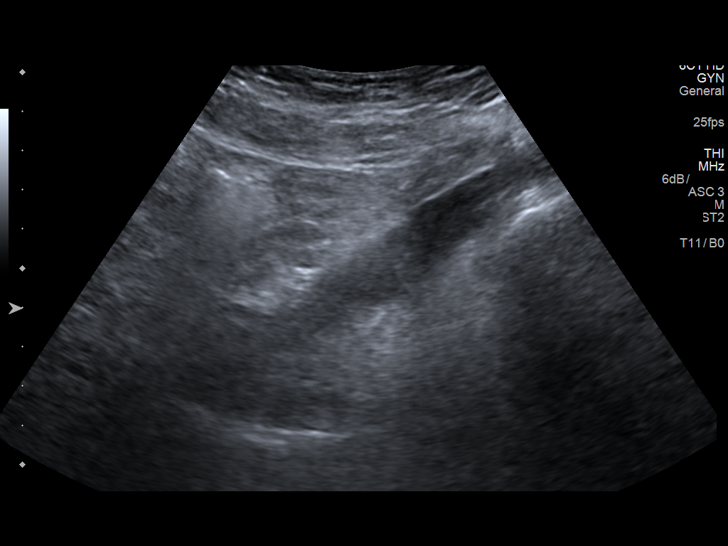
[im 29/87]
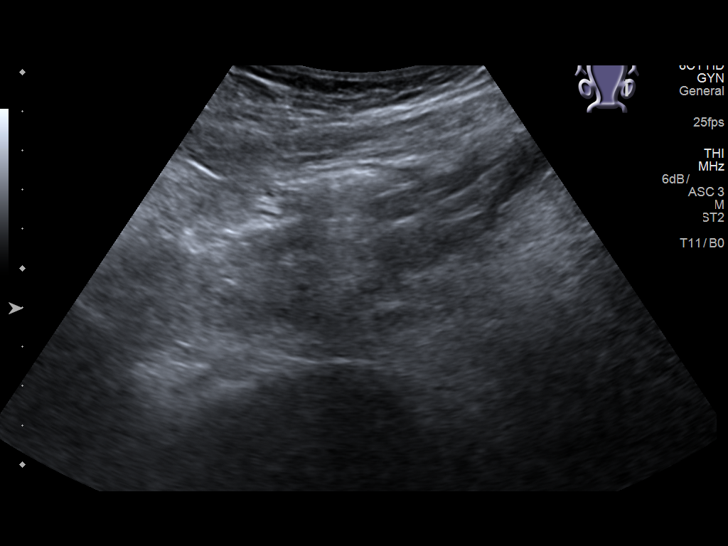
[im 36/87]
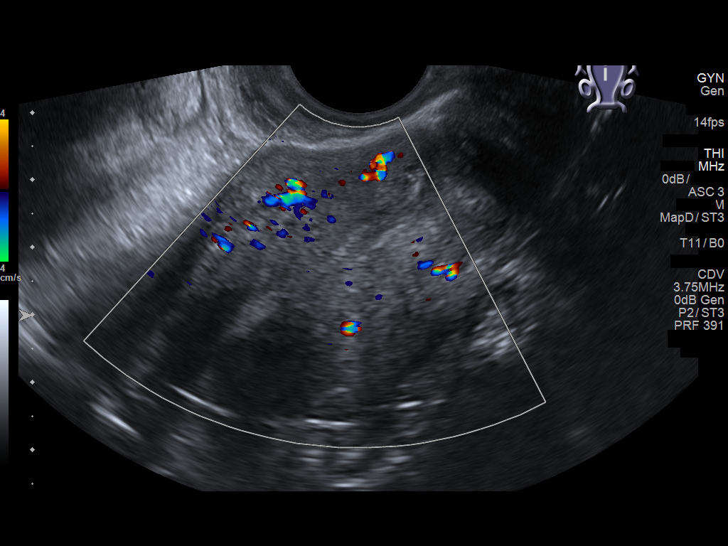
[im 44/87]
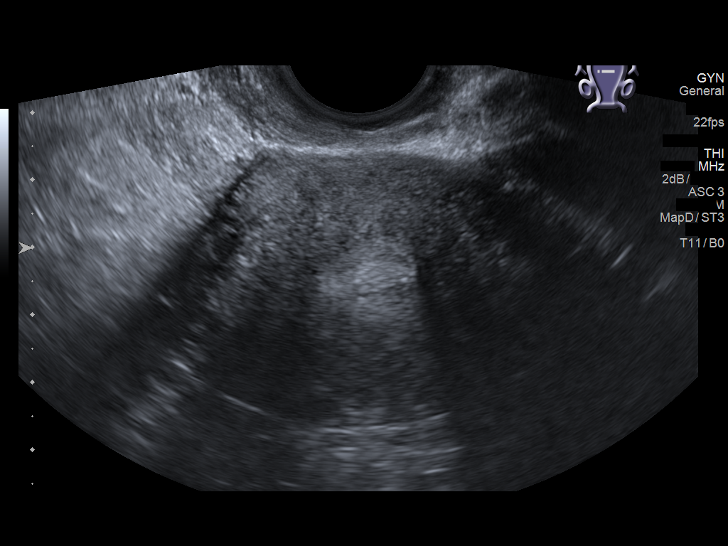
[im 51/87]
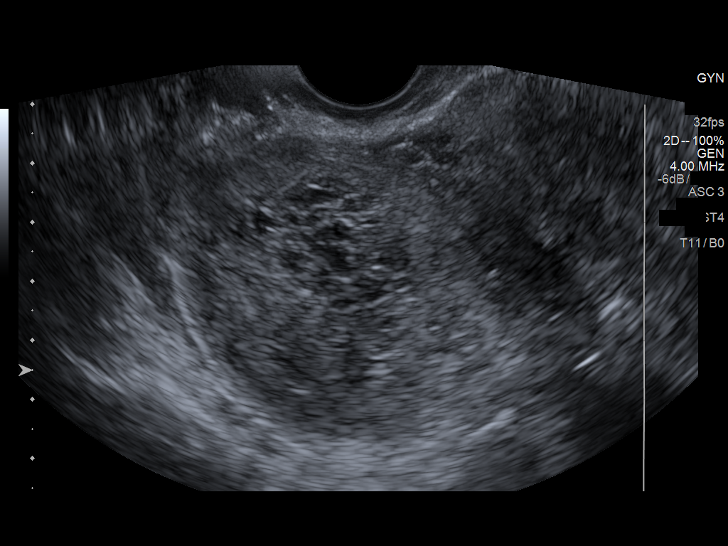
[im 58/87]
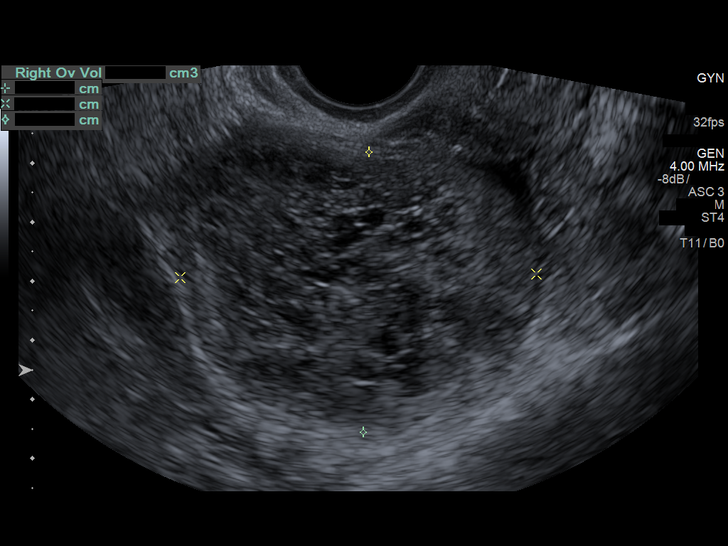
[im 65/87]
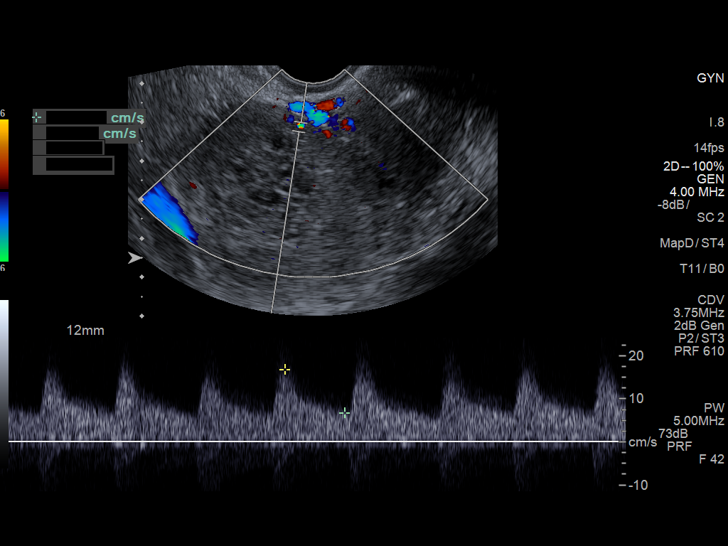
[im 72/87]
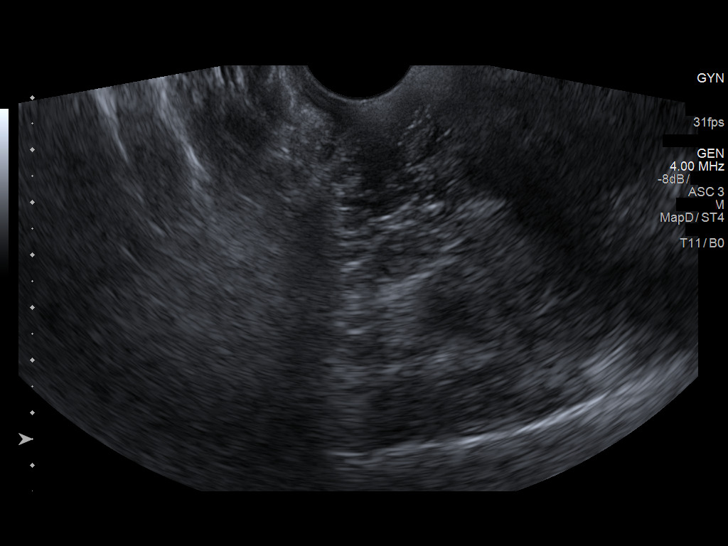
[im 79/87]
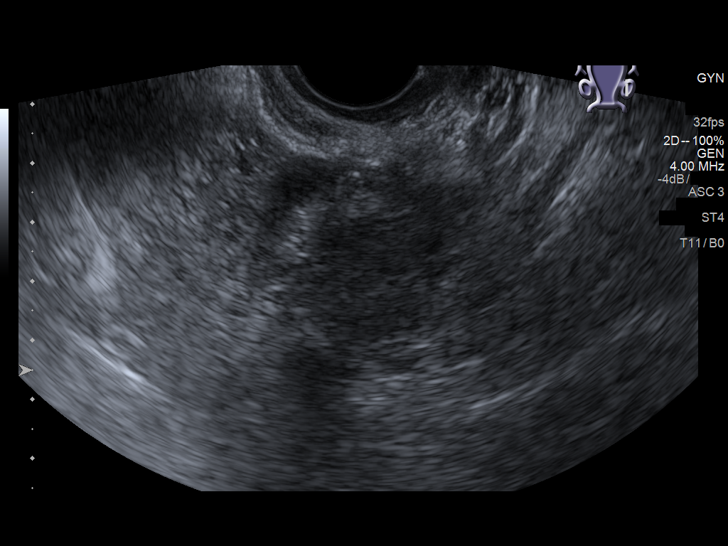
[im 87/87]
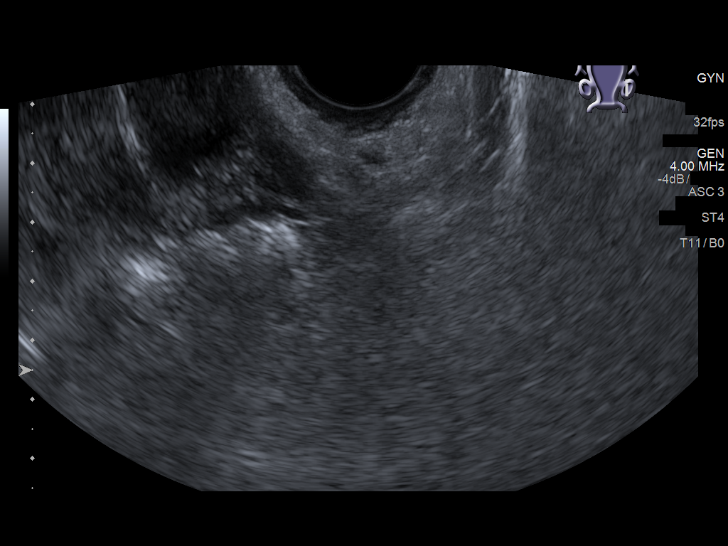

[13 of 25 positions shown; findings below may reference images not displayed]

FINDINGS: Uterus

Measurements: 8 x 4 x 4 cm. No fibroids or other mass visualized.

Endometrium

Thickness: 6 mm.  No focal abnormality visualized.

Right ovary

Measurements: 60 x 47 x 49 mm. Asymmetric enlargement secondary to
5.4 cm avascular mass with internal heterogeneous characteristics
that is somewhat lace-like. No solid vascular components are noted.

Left ovary

Measurements: 23 x 19 x 16 mm. Normal appearance/no adnexal mass.

Pulsed Doppler evaluation of both ovaries demonstrates normal
low-resistance arterial and venous waveforms.

Other findings

No abnormal free fluid.
IMPRESSION: 1. 5.4 cm right ovarian mass favoring hemorrhagic cyst or
endometrioma. Recommend sonographic follow-up in 6-12 weeks.
2. Symmetric ovarian blood flow.

## 2017-05-21 ENCOUNTER — Ambulatory Visit (INDEPENDENT_AMBULATORY_CARE_PROVIDER_SITE_OTHER): Payer: Self-pay | Admitting: Physician Assistant

## 2018-03-29 IMAGING — CT CT ABD-PELV W/O CM
2 of 4 series · 10 of 46 positions shown, 11 images · non-contrast
Comparison: None.

CLINICAL DATA: Right lower quadrant abdominal pain since yesterday.
Nausea and vomiting.

EXAM:
CT ABDOMEN AND PELVIS WITHOUT CONTRAST
TECHNIQUE: Multidetector CT imaging of the abdomen and pelvis was performed
following the standard protocol without IV contrast.

[Series 201: routine, idose (2) · axial · 0.85mm/px · z∈[+162,+537]mm · 7 of 91 slices shown, 8 images]
[im 8/91  soft-tissue]
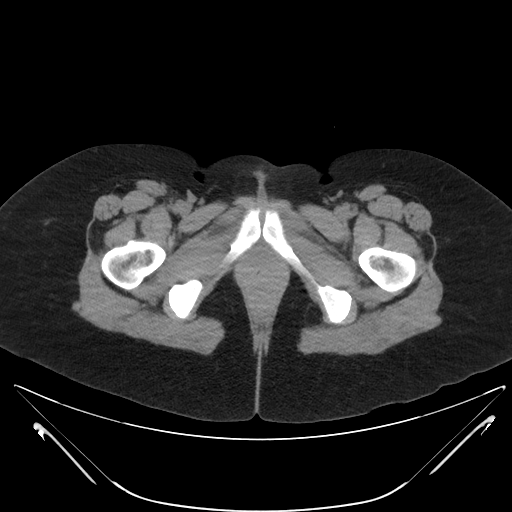
[im 8/91  bone]
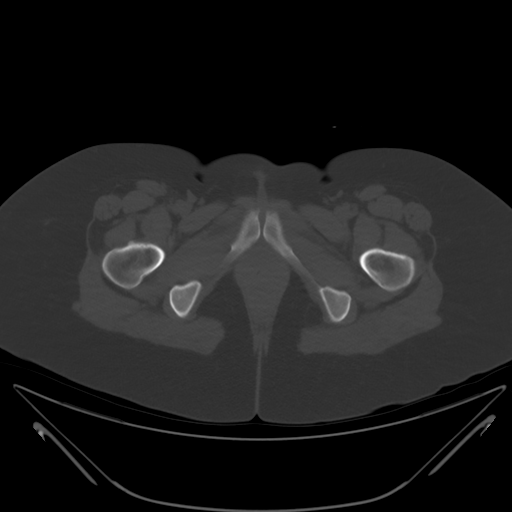
[im 19/91  soft-tissue]
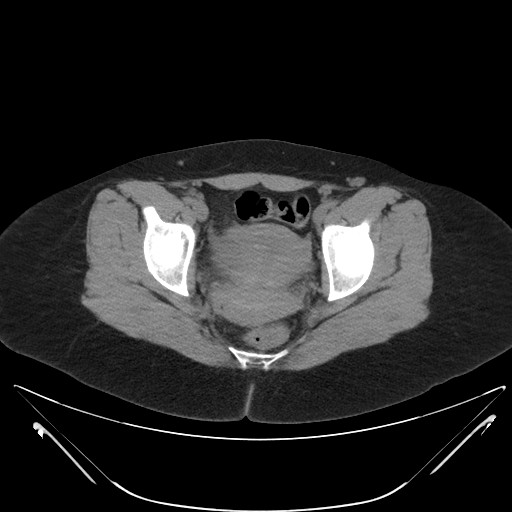
[im 34/91  soft-tissue]
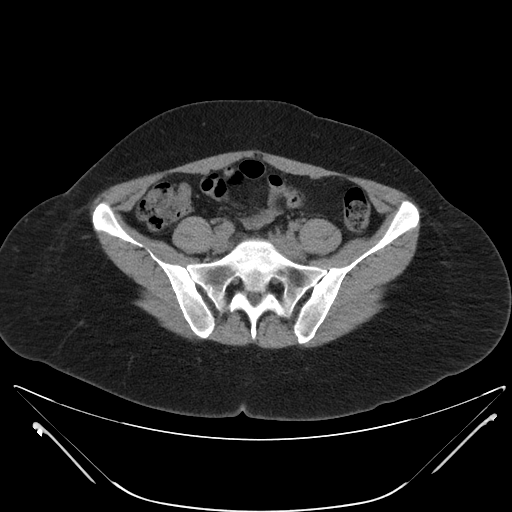
[im 46/91  soft-tissue]
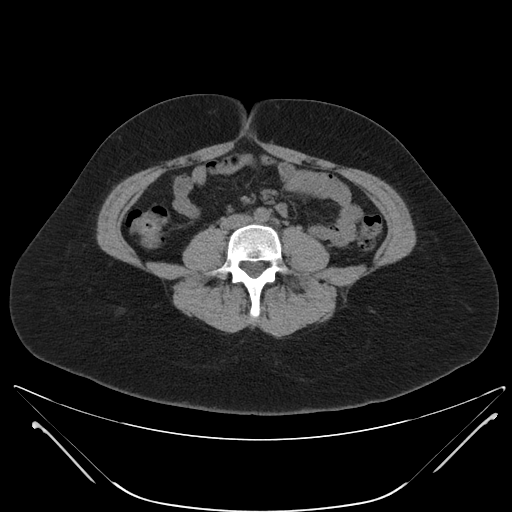
[im 57/91  soft-tissue]
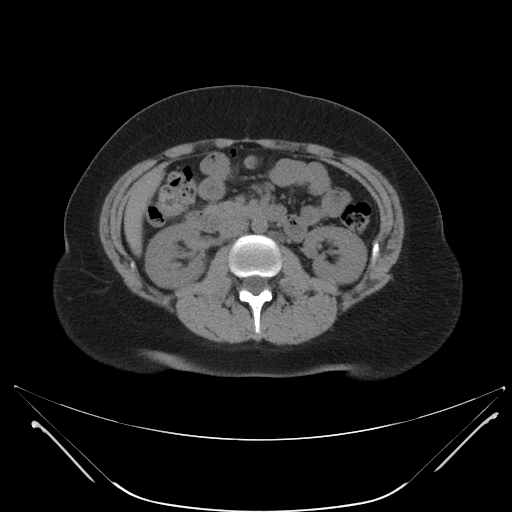
[im 72/91  soft-tissue]
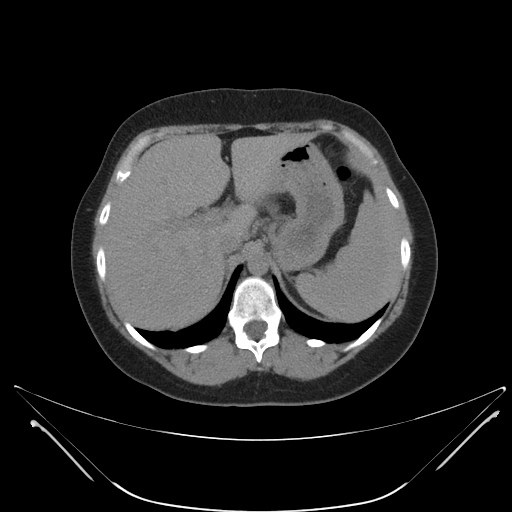
[im 83/91  soft-tissue]
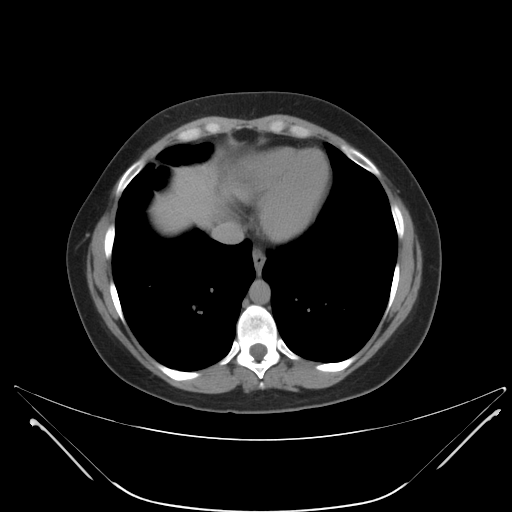

[Series 203: coronals, idose (2) · coronal · 0.45mm/px · 3 of 118 slices shown]
[im 40/118  soft-tissue]
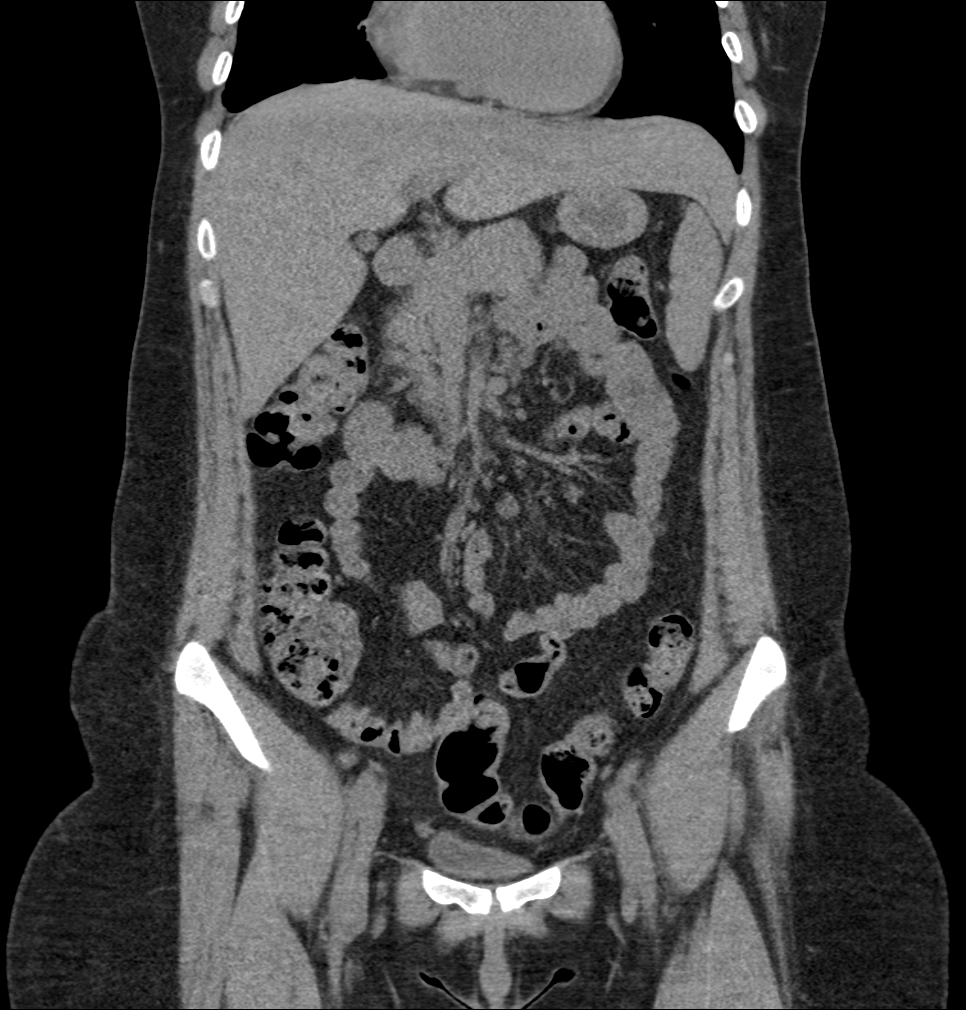
[im 53/118  soft-tissue]
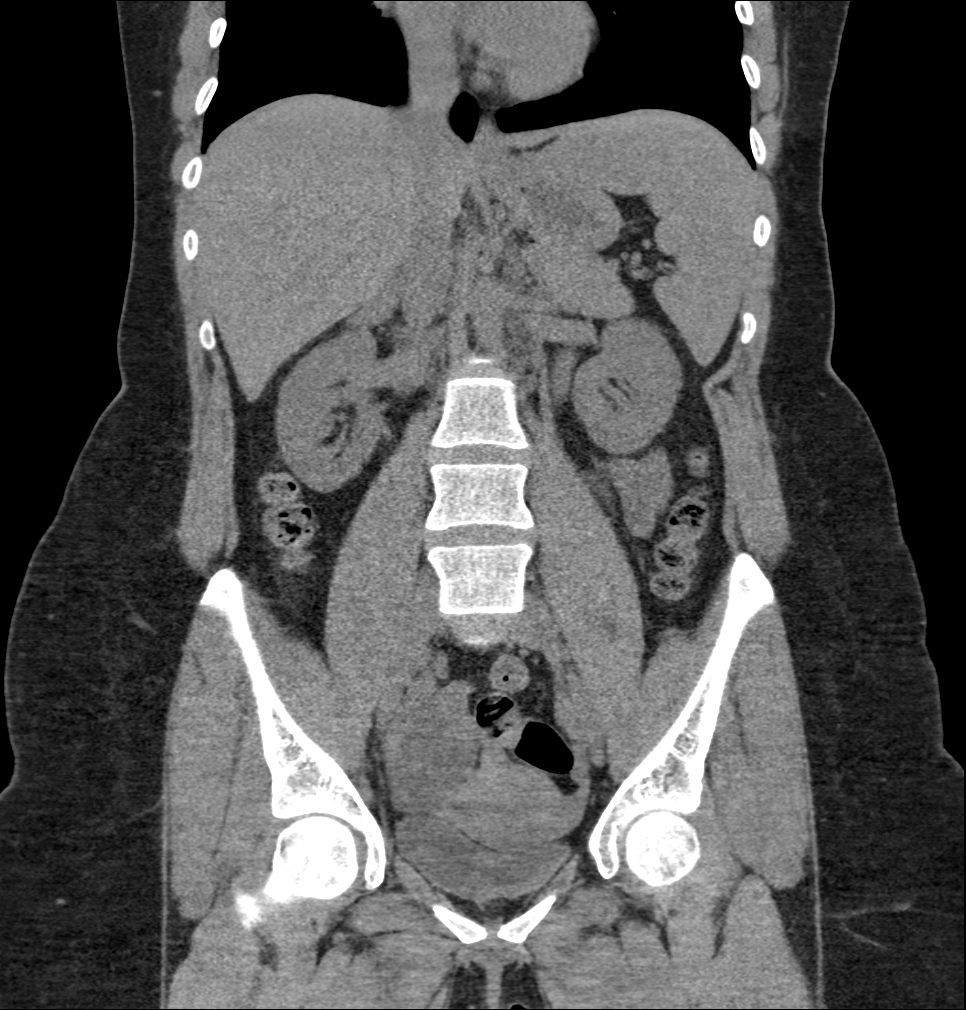
[im 66/118  soft-tissue]
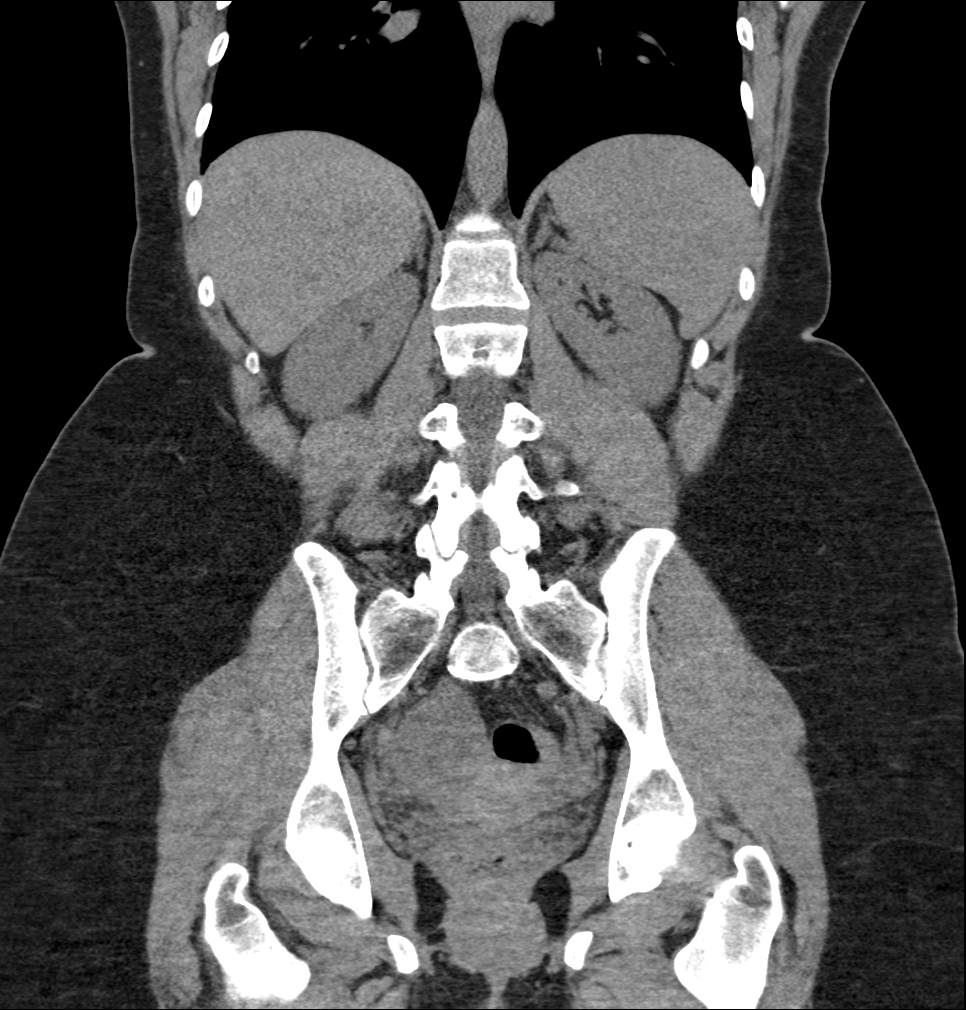

[10 of 46 positions shown; findings below may reference images not displayed]

FINDINGS: Lower chest: The lung bases are clear of acute process. No pleural
effusion or pulmonary lesions. Right middle lobe scarring changes
are noted. The heart is normal in size. No pericardial effusion. The
distal esophagus and aorta are unremarkable.

Hepatobiliary: No focal hepatic lesions or intrahepatic biliary
dilatation. The gallbladder is normal. No common bile duct
dilatation.

Pancreas: No mass, inflammation or ductal dilatation.

Spleen: Normal size.  No focal lesions.

Adrenals/Urinary Tract: The adrenal glands and kidneys are
unremarkable. No renal, ureteral or bladder calculi or mass.

Stomach/Bowel: The stomach, duodenum, small bowel and colon are
grossly normal without oral contrast. No inflammatory changes, mass
lesions or obstructive findings. The terminal ileum and appendix are
normal.

Vascular/Lymphatic: The aorta is normal in caliber. No
atherosclerotic calcifications. No mesenteric or retroperitoneal
mass or adenopathy. Small scattered lymph nodes are noted.

Reproductive: The uterus and left ovary are normal. The right ovary
contains a 5.6 x 5.0 cm cystic structure. This could be a simple
cyst. Given the patient's right lower quadrant abdominal pain and
normal appendix could not exclude the possibility of an ovarian
torsion although I do not see any inflammation for study study is
limited without contrast. Pelvic ultrasound may be helpful for
further evaluation. No significant free pelvic fluid collections.

Other: No pelvic mass or adenopathy. No free pelvic fluid
collections. No inguinal mass or adenopathy. No abdominal wall
hernia or subcutaneous lesions.

Musculoskeletal: No significant bony findings.
IMPRESSION: 5.6 x 5.0 cm right ovarian cyst. No definite CT findings to suggest
ovarian torsion. If that is a clinical concern pelvic ultrasound may
be helpful for further evaluation.

No other significant abdominal/ pelvic findings. The appendix is
normal and no renal, ureteral or bladder calculi or obstructive
uropathy.
